# Patient Record
Sex: Female | Born: 1950 | Race: Black or African American | Hispanic: No | Marital: Single | Smoking: Current every day smoker
Health system: Southern US, Community
[De-identification: ages and names within clinical notes are randomized; demographics above are authoritative.]

## PROBLEM LIST (undated history)

## (undated) DIAGNOSIS — G43909 Migraine, unspecified, not intractable, without status migrainosus: Secondary | ICD-10-CM

## (undated) DIAGNOSIS — Z86010 Personal history of colonic polyps: Secondary | ICD-10-CM

## (undated) DIAGNOSIS — F32A Depression, unspecified: Secondary | ICD-10-CM

## (undated) DIAGNOSIS — M797 Fibromyalgia: Secondary | ICD-10-CM

## (undated) DIAGNOSIS — E119 Type 2 diabetes mellitus without complications: Secondary | ICD-10-CM

## (undated) DIAGNOSIS — G4730 Sleep apnea, unspecified: Secondary | ICD-10-CM

## (undated) DIAGNOSIS — I10 Essential (primary) hypertension: Secondary | ICD-10-CM

## (undated) DIAGNOSIS — F329 Major depressive disorder, single episode, unspecified: Secondary | ICD-10-CM

## (undated) DIAGNOSIS — E785 Hyperlipidemia, unspecified: Secondary | ICD-10-CM

## (undated) HISTORY — DX: Fibromyalgia: M79.7

## (undated) HISTORY — DX: Sleep apnea, unspecified: G47.30

## (undated) HISTORY — PX: TUBAL LIGATION: SHX77

## (undated) HISTORY — DX: Depression, unspecified: F32.A

## (undated) HISTORY — DX: Type 2 diabetes mellitus without complications: E11.9

## (undated) HISTORY — DX: Hyperlipidemia, unspecified: E78.5

## (undated) HISTORY — DX: Major depressive disorder, single episode, unspecified: F32.9

## (undated) HISTORY — PX: KNEE ARTHROSCOPY: SUR90

## (undated) HISTORY — DX: Essential (primary) hypertension: I10

## (undated) HISTORY — DX: Migraine, unspecified, not intractable, without status migrainosus: G43.909

## (undated) HISTORY — DX: Personal history of colonic polyps: Z86.010

---

## 1978-09-05 HISTORY — PX: CHOLECYSTECTOMY: SHX55

## 1998-09-05 HISTORY — PX: BREAST BIOPSY: SHX20

## 2001-09-05 HISTORY — PX: APPENDECTOMY: SHX54

## 2002-09-05 HISTORY — PX: ABDOMINAL HYSTERECTOMY: SHX81

## 2003-05-09 ENCOUNTER — Encounter: Payer: Self-pay | Admitting: Obstetrics and Gynecology

## 2003-05-09 ENCOUNTER — Ambulatory Visit (HOSPITAL_COMMUNITY): Admission: RE | Admit: 2003-05-09 | Discharge: 2003-05-09 | Payer: Self-pay | Admitting: Obstetrics and Gynecology

## 2003-05-29 ENCOUNTER — Encounter: Payer: Self-pay | Admitting: Obstetrics and Gynecology

## 2003-05-29 ENCOUNTER — Ambulatory Visit (HOSPITAL_COMMUNITY): Admission: RE | Admit: 2003-05-29 | Discharge: 2003-05-29 | Payer: Self-pay | Admitting: Obstetrics and Gynecology

## 2003-06-25 ENCOUNTER — Other Ambulatory Visit: Admission: RE | Admit: 2003-06-25 | Discharge: 2003-06-25 | Payer: Self-pay | Admitting: Obstetrics and Gynecology

## 2003-07-07 ENCOUNTER — Inpatient Hospital Stay (HOSPITAL_COMMUNITY): Admission: RE | Admit: 2003-07-07 | Discharge: 2003-07-09 | Payer: Self-pay | Admitting: Obstetrics and Gynecology

## 2004-11-25 ENCOUNTER — Ambulatory Visit (HOSPITAL_COMMUNITY): Admission: RE | Admit: 2004-11-25 | Discharge: 2004-11-25 | Payer: Self-pay | Admitting: Orthopedic Surgery

## 2009-09-05 DIAGNOSIS — Z86010 Personal history of colonic polyps: Secondary | ICD-10-CM

## 2009-09-05 DIAGNOSIS — Z860101 Personal history of adenomatous and serrated colon polyps: Secondary | ICD-10-CM

## 2009-09-05 HISTORY — DX: Personal history of colonic polyps: Z86.010

## 2009-09-05 HISTORY — DX: Personal history of adenomatous and serrated colon polyps: Z86.0101

## 2010-01-03 HISTORY — PX: LOW ANTERIOR BOWEL RESECTION: SUR1240

## 2010-01-05 ENCOUNTER — Inpatient Hospital Stay (HOSPITAL_COMMUNITY)
Admission: AD | Admit: 2010-01-05 | Discharge: 2010-01-08 | Payer: Self-pay | Source: Home / Self Care | Admitting: Family Medicine

## 2010-01-06 ENCOUNTER — Ambulatory Visit: Payer: Self-pay | Admitting: Internal Medicine

## 2010-01-07 ENCOUNTER — Encounter (INDEPENDENT_AMBULATORY_CARE_PROVIDER_SITE_OTHER): Payer: Self-pay | Admitting: Family Medicine

## 2010-01-07 ENCOUNTER — Ambulatory Visit: Payer: Self-pay | Admitting: Internal Medicine

## 2010-01-07 HISTORY — PX: ESOPHAGOGASTRODUODENOSCOPY: SHX1529

## 2010-01-07 HISTORY — PX: COLONOSCOPY: SHX174

## 2010-01-12 ENCOUNTER — Telehealth: Payer: Self-pay | Admitting: Gastroenterology

## 2010-01-13 ENCOUNTER — Encounter (INDEPENDENT_AMBULATORY_CARE_PROVIDER_SITE_OTHER): Payer: Self-pay

## 2010-01-25 ENCOUNTER — Encounter (INDEPENDENT_AMBULATORY_CARE_PROVIDER_SITE_OTHER): Payer: Self-pay | Admitting: General Surgery

## 2010-01-25 ENCOUNTER — Inpatient Hospital Stay (HOSPITAL_COMMUNITY): Admission: RE | Admit: 2010-01-25 | Discharge: 2010-01-29 | Payer: Self-pay | Admitting: General Surgery

## 2010-01-26 ENCOUNTER — Encounter (INDEPENDENT_AMBULATORY_CARE_PROVIDER_SITE_OTHER): Payer: Self-pay

## 2010-02-18 ENCOUNTER — Encounter: Payer: Self-pay | Admitting: Internal Medicine

## 2010-02-22 ENCOUNTER — Encounter: Payer: Self-pay | Admitting: Internal Medicine

## 2010-10-05 NOTE — Miscellaneous (Signed)
Summary: surical path  Clinical Lists Changes SP-Surgical Pathology - STATUS: Final  .                                         Perform Date: 5 May11 00:01  Ordered By: Raiford Simmonds Date:  Facility: [facility]                               Department: CPATH  Service Report Text  [facility]   [address], [address]   [place], [place] [address]   Telephone [phone] or [phone] Fax [phone]    REPORT OF SURGICAL PATHOLOGY    Case #: [id number]   Patient Name: Katelyn Powell, Katelyn Powell   Office Chart Number: [id number]    MRN: [id number]   Pathologist: Laureen Ochs M.D., Jessica Priest   DOB/Age [birth year] (Age: 60) Gender: F   Date Taken: 01/07/2010   Date Received: 01/07/2010    FINAL DIAGNOSIS   ***Microscopic Examination and Diagnosis***    1. STOMACH, BIOPSY, ANTRAL LESION : BENIGN GASTRIC MUCOSA WITH MILD CHRONIC   GASTRITIS AND REACTIVE EPITHELIAL CHANGES.NO INTESTINAL   METAPLASIA OR HELICOBACTER PYLORI ORGANISMS IDENTIFIED.   2. COLON, POLYP(S), ILEOCECAL VALVE : ADENOMATOUS POLYP(S).NO HIGH GRADE   DYSPLASIA OR INVASIVE MALIGNANCY IDENTIFIED.   3. COLON, POLYP(S), DESCENDING : ADENOMATOUS POLYP(S).NO HIGH GRADE DYSPLASIA OR   INVASIVE MALIGNANCY IDENTIFIED.   4. COLON, POLYP(S), SIGMOID : ADENOMATOUS POLYP(S).NO HIGH GRADE DYSPLASIA OR   INVASIVE MALIGNANCY IDENTIFIED.   5. RECTUM, BIOPSY, RECTAL MASS : FRAGMENTS OF TUBULOVILLOUS ADENOMA.NO HIGH   GRADE DYSPLASIA OR INVASIVE MALIGNANCY IDENTIFIED.    *** Electronically Signed Out by Smir M.D., Bassam, Pathologist, Electronic Signature ***    CLINICAL HISTORY    SPECIMEN(S) OBTAINED   1. Stomach, biopsy, Antral Lesion   2. Colon, polyp(s), Ileocecal Valve   3. Colon, polyp(s), Descending   4. Colon, polyp(s), Sigmoid   5. Rectum, biopsy, Rectal Mass    SPECIMEN COMMENTS:   5. Anmeia; gi bleed    Gross Description   1. Received in formalin are tan, soft tissue fragments that are  submitted in   toto.Number: Three, size: 0.3 to 0.5 cm, (1 b)   2. Received in formalin are tan, soft tissue fragments that are submitted in   toto.Number: Four, size: 0.2 to 0.4 cm, (1b)   3. Received in formalin are two tan mucosal polyps measuring 0.4 and 1.2 cm in   greatest dimension.The largest is inked and sectioned.The specimen is entirely   submitted in one cassette.   4. Received in formalin are two rubbery tan red mucosal polyps which measure 1.0   and 1.8 cm in greatest dimension.The polyps are inked and sectioned.The specimen   is entirely submitted in three cassettes.   A= one sectioned polyp   B-C= on sectioned polyp   5. Received in formalin are tan, soft tissue fragments that are submitted in   toto.Number: Multiple, size: 0.1 cm smallest to 0.3 cm largest, (1 b) ( gp:mw   01/07/10 )    MICROSCOPIC DESCRIPTION   1. A warthin-starry stain is performed to determine the possibility of the   presence of helicobacter pylori.The warthin-starry stain is negative for   organisms of helicobacter pylori.   (Bns:Mw  01/08/10)    CASE COMMENTS   STAINS USED IN DIAGNOSIS:   Warthin-Starry stain   Additional Information  HL7 RESULT STATUS : F  External IF Update Timestamp : 2010-01-08:17:00:00.000000

## 2010-10-05 NOTE — Progress Notes (Signed)
Summary: RECTAL MASS  Phone Note Outgoing Call Call back at Home Phone [phone]   Reason for Call: Discuss lab or test results Summary of Call: Spoke with pt. Explained CT Scan and CXR-rectal mass, enlarged LNs. CEA not elevated. Proceed with General Surgery Consult: Dr. Lovell Sheehan.      Appended Document: RECTAL MASS I spoke with the pt and gave her Dr Lovell Sheehan office number..I faxed all clinicals to his office.

## 2010-10-05 NOTE — Letter (Signed)
Summary: PATH REPORT  PATH REPORT   Imported By: Ave Filter 02/18/2010 13:42:38  _____________________________________________________________________  External Attachment:    Type:   Image     Comment:   External Document

## 2010-10-05 NOTE — Letter (Signed)
Summary: dischargesummary-dr jenkins  dischargesummary-dr jenkins   Imported By: Rosine Beat 02/22/2010 11:05:44  _____________________________________________________________________  External Attachment:    Type:   Image     Comment:   External Document

## 2010-10-05 NOTE — Miscellaneous (Signed)
Summary: reports from [facility]  Clinical Lists Changes NAME:  Katelyn Powell, Katelyn Powell NO.:  [id number]      MEDICAL RECORD NO.:  [id number]          PATIENT TYPE:  INP      LOCATION:  A322                          FACILITY:  [facility]      PHYSICIAN:  Jonette Eva, M.D.     DATE OF BIRTH:  03/12/1951      DATE OF CONSULTATION:  01/05/2010   DATE OF DISCHARGE:                                    CONSULTATION     REFERRING PROVIDER:  Oval Linsey, MD      REASON FOR CONSULTATION:  Anemia.      HISTORY OF PRESENT ILLNESS:  Katelyn Powell is a 60 year old female who has   never had a colonoscopy or an upper endoscopy.  She presented with   palpitations.  Her hemoglobin was found to be 5.4 with a MCV of 61.6.   She states that her appetite is pretty good.  She has had an intentional   weight loss from 197 pounds to 160 pounds.  She occasionally sees rectal   bleeding.  Her last bowel movement was Saturday and was hard.  She often   has problems with constipation.  She denies any nausea, vomiting,   dysuria, hematuria, heartburn, indigestion, chest pain, shortness of   breath or problems swallowing.  She had a syncopal episode in September   2010.  She has been feeling faint off and on.  She says she eats meat   sometimes.      PAST MEDICAL HISTORY:   1. Hypertension.   2. Diabetes.   3. Hyperlipidemia.   4. Depression.   5. Fibromyalgia.      PAST SURGICAL HISTORY:   1. Hysterectomy.   2. Cholecystectomy.   3. Tubal ligation.   4. Appendectomy.      ALLERGIES:  PENICILLIN, SULFA, CEPHALOSPORINS, LYRICA.      MEDICATIONS:   1. Vitamin D.   2. Catapres.   3. Glipizide.   4. Glucophage.   5. Pamelor.   6. Benicar.   7. Zocor      FAMILY HISTORY:  She denies any family history of colon cancer or colon   polyps.      SOCIAL HISTORY:  She smokes less than half pack a day.  She is divorced.   She does not drink alcohol.  She has three kids.  She has been disabled   from multiple reasons.  She used to work at [employer].      REVIEW OF SYSTEMS:  As per the HPI.  Otherwise all systems are negative.      PHYSICAL EXAM:  VITAL SIGNS: Afebrile, hemodynamically stable.   GENERAL:  She is in no apparent distress, alert and oriented x4.   HEENT:  Exam is atraumatic, normocephalic.  Pupils equal and react to   light.  Mouth:  No oral lesions.  Posterior pharynx __________ .   NECK:  Has full range of motion.  No lymphadenopathy.   LUNGS:  Clear to auscultation bilaterally.   CARDIOVASCULAR:  Regular rhythm,  no murmur, normal S1, S2.   ABDOMEN:  Bowel sounds present.  Soft, nontender, nondistended.  No   rebound or guarding.   EXTREMITIES:  No cyanosis or edema.   NEURO:  She has no focal neurologic deficits.      LABS:  White count 7.5, platelets 366.  PT/PTT pending.      ASSESSMENT:  Katelyn Powell is a 60 year old female who presents with a   microcytic anemia.  The differential diagnosis includes colorectal   cancer/polyp, arteriovenous malformations, and a low likelihood of   gastric cancer.      Thank you for allowing me to see Ms. Aul in consultation.  My   recommendations follow.      RECOMMENDATIONS:   1. The patient may have a full liquid diet on today.  She will start a       clear liquid diet on tomorrow.  She should receive three doses of       MiraLAX today.   2. Transfuse 4 units of packed red blood cells, each unit over 3 to 4       hours.   3. She was started bowel prep on tomorrow.  She will be scheduled for       her colonoscopy and EGD on Jan 07, 2010.   4. Check BMP and ferritin.   5. Start D5 normal saline at 1700 hours on Jan 06, 2010.   6. Protonix for GI prophylaxis.               Jonette Eva, M.D.            SF/MEDQ  D:  01/05/2010  T:  01/05/2010  Job:  [id number]      cc:   Melvyn Novas, MD   Fax: [phone]     NAME:  Powell, Katelyn              ACCOUNT NO.:  [id number]      MEDICAL RECORD NO.:   [id number]          PATIENT TYPE:  INP      LOCATION:  A322                          FACILITY:  [facility]      PHYSICIAN:  R. Roetta Sessions, M.D. DATE OF BIRTH:  [birth year]      DATE OF PROCEDURE:  01/07/2010   DATE OF DISCHARGE:                                  OPERATIVE REPORT     PROCEDURE:  EGD with biopsy followed by colonoscopy with snare   polypectomy biopsy, polyp ablation.      INDICATIONS FOR PROCEDURE:  A 60 year old lady who presents with   profound microcytic iron-deficiency anemia, rectal bleeding, Hemoccult   positive stool.  No prior EGD or colonoscopy.  EGD and colonoscopy now   being done.  Risks, benefits, alternatives,  limitations, imponderables   have been discussed, questions answered.  All parties agreeable.      PROCEDURE NOTE:   O2 saturation, blood pressure, pulse, respirations monitored throughout   the entirety of the procedure.      CONSCIOUS SEDATION:  Versed 7 mg IV, Demerol 125 mg IV in divided doses.      Cetacaine spray for topical pharyngeal anesthesia.      INSTRUMENT:  Pentax video chip system.      FINDINGS:  EGD examination:  Tubular esophagus revealed no mucosal   abnormalities.  The EG junction easily traversed.      STOMACH:   The gastric cavity was emptied and insufflated well with air.  Thorough   examination of the gastric mucosa including retroflex of the proximal   stomach and esophagogastric junction demonstrated a small hiatal hernia,   a couple of linear antral erosions, a 1.5 cm area of raised mucosa   superior to the pylorus and antrum with an area of centrally eroded   mucosa.  This lesion had a doughnut type appearance.  No obvious   infiltrating process, but it did stand out.  The remaining gastric   mucosa appeared unremarkable.  Pylorus was patent and easily traversed.      DUODENUM:   Examination of the bulb and second portion revealed 2 nonbleeding D2   AVMs.  Otherwise, duodenum was unremarkable.       THERAPEUTIC/DIAGNOSTIC MANEUVERS:  The 1.5 cm area of abnormality at the   antrum was biopsied for histologic study.  The patient tolerated the   procedure well and was prepared for colonoscopy.      Digital rectal exam revealed no abnormalities.      Scope findings:      Prep was adequate.      RECTUM:  Examination of rectal mucosa revealed a large exophytic   polypoid mass beginning at 12 cm from the anal verge extending   approximately 15 cm (more proximal rectum) distal to 12 cm.  Rectal   mucosa appeared entirely normal.  Thorough retroflexion was performed.   Again, this was not appreciable on digital rectal exam.  Lumen was not   particularly compromised.      I was able to get up into the more proximal lower GI tract without   difficulty.  Examination of the colon was undertaken.  I easily advanced   the scope through the left, transverse and right colon to the   appendiceal orifice, ileocecal valve/cecum.  These structures were well   seen and photographed for the record.  From this level, the scope was   slowly withdrawn.  All previously mentioned mucosal surfaces were again   seen.  The following abnormalities were noted:   Left-sided transverse diverticula.  The patient had a flat 8-mm polyp   between 2 folds opposite the ileocecal valve.  This lesion was removed   with hot snare cautery.  The patient had multiple other large polyps in   the splenic flexure, descending and sigmoid segments.  The largest   polyps were in the sigmoid where the very largest was a 2 cm   pedunculated polyp on a long stalk.  Polyps were either hot snared with   a couple of diminutive polyps ablated at the splenic flexure and sigmoid   segment.  All polyps seen were treated/removed.  Attention was turned to   the lesion in the rectum where multiple biopsies were taken utilizing   jumbo biopsy forceps.  The patient tolerated both procedures well.   Cecal withdrawal time 39 minutes.       IMPRESSION:  EGD normal esophagus, small hiatal hernia, antral erosions   1.5 cm doughnut shaped lesion as described above in the antrum of   uncertain significance, biopsied.  Patent pylorus, nonbleeding AVMs and   DT.      COLONOSCOPY FINDINGS:   1. 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  Multiple biopsies taken.   2. Multiple colonic polyps as described above, either ablated or snare       removed.   3. Left-sided transverse diverticula.  Recommendations:  Clear liquid       diet.   4. Follow up on pathology.   5. The patient will need at a minimum surgical consultation for a low       anterior resection of the rectal mass.  All pathology will need to       be reviewed prior to making final recommendations regarding       surgery.               Jonathon Bellows, M.D.            RMR/MEDQ  D:  01/07/2010  T:  01/07/2010  Job:  [id number]      cc:   Melvyn Novas, MD   Fax: [phone]     SP-Surgical Pathology - STATUS: Final  .                                         Perform Date: 5 May11 00:01  Ordered By: Raiford Simmonds Date:  Facility: [facility]                               Department: [facility]  Service Report Text  [facility]   [address], [address]   [place], [place] [address]   Telephone [phone] or [phone] Fax [phone]    REPORT OF SURGICAL PATHOLOGY    Case #: [id number]   Patient Name: CARESSE, SEDIVY   Office Chart Number: [id number]    MRN: [id number]   Pathologist: Laureen Ochs M.D., Jessica Priest   DOB/Age 03-30-1951 (Age: 40) Gender: F   Date Taken: 01/07/2010   Date Received: 01/07/2010    FINAL DIAGNOSIS   ***Microscopic Examination and Diagnosis***    1. STOMACH, BIOPSY, ANTRAL LESION : BENIGN GASTRIC MUCOSA WITH MILD CHRONIC   GASTRITIS AND REACTIVE  EPITHELIAL CHANGES.NO INTESTINAL   METAPLASIA OR HELICOBACTER PYLORI ORGANISMS IDENTIFIED.   2. COLON, POLYP(S), ILEOCECAL VALVE : ADENOMATOUS POLYP(S).NO HIGH GRADE   DYSPLASIA OR INVASIVE MALIGNANCY IDENTIFIED.   3. COLON, POLYP(S), DESCENDING : ADENOMATOUS POLYP(S).NO HIGH GRADE DYSPLASIA OR   INVASIVE MALIGNANCY IDENTIFIED.   4. COLON, POLYP(S), SIGMOID : ADENOMATOUS POLYP(S).NO HIGH GRADE DYSPLASIA OR   INVASIVE MALIGNANCY IDENTIFIED.   5. RECTUM, BIOPSY, RECTAL MASS : FRAGMENTS OF TUBULOVILLOUS ADENOMA.NO HIGH   GRADE DYSPLASIA OR INVASIVE MALIGNANCY IDENTIFIED.    *** Electronically Signed Out by Smir M.D., Bassam, Pathologist, Electronic Signature ***    CLINICAL HISTORY    SPECIMEN(S) OBTAINED   1. Stomach, biopsy, Antral Lesion   2. Colon, polyp(s), Ileocecal Valve   3. Colon, polyp(s), Descending   4. Colon, polyp(s), Sigmoid   5. Rectum, biopsy, Rectal Mass    SPECIMEN COMMENTS:   5. Anmeia; gi bleed    Gross Description   1. Received in formalin are tan, soft tissue fragments that are submitted in   toto.Number:  Three, size: 0.3 to 0.5 cm, (1 b)   2. Received in formalin are tan, soft tissue fragments that are submitted in   toto.Number: Four, size: 0.2 to 0.4 cm, (1b)   3. Received in formalin are two tan mucosal polyps measuring 0.4 and 1.2 cm in   greatest dimension.The largest is inked and sectioned.The specimen is entirely   submitted in one cassette.   4. Received in formalin are two rubbery tan red mucosal polyps which measure 1.0   and 1.8 cm in greatest dimension.The polyps are inked and sectioned.The specimen   is entirely submitted in three cassettes.   A= one sectioned polyp   B-C= on sectioned polyp   5. Received in formalin are tan, soft tissue fragments that are submitted in   toto.Number: Multiple, size: 0.1 cm smallest to 0.3 cm largest, (1 b) ( gp:mw   01/07/10 )    MICROSCOPIC DESCRIPTION   1. A warthin-starry stain is performed to determine  the possibility of the   presence of helicobacter pylori.The warthin-starry stain is negative for   organisms of helicobacter pylori.   (Bns:Mw 01/08/10)    CASE COMMENTS   STAINS USED IN DIAGNOSIS:   Warthin-Starry stain   Additional Information  HL7 RESULT STATUS : F  External IF Update Timestamp : 2010-01-08:17:00:00.000000    CT Abd/Pelvis W CM - STATUS: Final  IMAGE                                     Perform Date: 6 May11 16:30  Ordered By: Raiford Simmonds Date: 6 May11 14:27  Facility: [facility]                               Department: CT  Service Report Text  [facility] Accession Number: [id number]      Clinical Data: Rectal cancer found on recent colonoscopy.  Prior   cholecystectomy, hysterectomy and appendectomy.  Known hiatal   hernia.    CT ABDOMEN AND PELVIS WITH CONTRAST    Technique:  Multidetector CT imaging of the abdomen and pelvis was   performed following the standard protocol during bolus   administration of intravenous contrast.    Contrast: 100 ml Omnipaque-300.    Comparison: None.    Findings: Large mass distal rectum/distal sigmoid colon.  Small   amount of free fluid in the pelvis and Morison's pouch.  This may   be related to malignancy.  No free air to suggest complication of   colonoscopy.    Largest lymph node in the pelvis and in the right obturator region   with with maximal transverse dimension of 19.4 x 7.4 mm.    Elongated lymph node to the left of the aorta at the takeoff of the   superior mesenteric artery (series 2 image 26) measures 21.6 x 6.2   mm.    Within the dome of the liver very questionable 4.6 x 4 x 4.3 mm   lesion (series 4 image 56 and series 5 image 21). No other liver   lesions.    Mild hyperplasia of the left adrenal gland.  No nodularity to   suggest adrenal lesion.  Nonobstructing right renal calculi.  No   renal, splenic or pancreatic mass.    Atherosclerotic type changes of the  aorta and  branch vessels   without evidence of abdominal aortic aneurysm.    Evaluation of portions of the bowel and stomach limited secondary   to under distension.    No bony destructive lesion.  Prominent L5-S1 facet joint   degenerative changes with minimal anterior slip of L5 and bulge   with subsequent bilateral foraminal narrowing.    IMPRESSION:   Large mass distal rectum/distal sigmoid colon.  Small amount of   free fluid in the pelvis and Morison's pouch.  This may be related   to malignancy.  No free air to suggest complication of colonoscopy.    Elongated left periaortic and right obturator lymph nodes.  No   pathologically enlarged lymph nodes.    Within the dome of the liver very questionable 4.6 x 4 x 4.3 mm   lesion (series 4 image 56 and series 5 image 21). No other liver   lesions noted.    Read By:  Fuller Canada,  M.D.   Released By:  Fuller Canada,  M.D.  Additional Information  HL7 RESULT STATUS : F  External image : [id number]  External IF Update Timestamp : 2010-01-08:17:16:34.000000

## 2010-11-22 LAB — CBC
HCT: 29.1 % — ABNORMAL LOW (ref 36.0–46.0)
HCT: 32 % — ABNORMAL LOW (ref 36.0–46.0)
HCT: 36.2 % (ref 36.0–46.0)
Hemoglobin: 11.8 g/dL — ABNORMAL LOW (ref 12.0–15.0)
Hemoglobin: 8.8 g/dL — ABNORMAL LOW (ref 12.0–15.0)
MCHC: 30.3 g/dL (ref 30.0–36.0)
MCHC: 32.7 g/dL (ref 30.0–36.0)
MCHC: 33 g/dL (ref 30.0–36.0)
MCV: 73.9 fL — ABNORMAL LOW (ref 78.0–100.0)
MCV: 74.1 fL — ABNORMAL LOW (ref 78.0–100.0)
MCV: 74.7 fL — ABNORMAL LOW (ref 78.0–100.0)
Platelets: 332 10*3/uL (ref 150–400)
Platelets: 395 10*3/uL (ref 150–400)
RBC: 3.91 MIL/uL (ref 3.87–5.11)
RBC: 4.29 MIL/uL (ref 3.87–5.11)
RDW: 29.3 % — ABNORMAL HIGH (ref 11.5–15.5)
RDW: 31.9 % — ABNORMAL HIGH (ref 11.5–15.5)
RDW: 32 % — ABNORMAL HIGH (ref 11.5–15.5)
WBC: 19.8 10*3/uL — ABNORMAL HIGH (ref 4.0–10.5)
WBC: 22.2 10*3/uL — ABNORMAL HIGH (ref 4.0–10.5)
WBC: 22.8 10*3/uL — ABNORMAL HIGH (ref 4.0–10.5)

## 2010-11-22 LAB — HEPATIC FUNCTION PANEL
ALT: 20 U/L (ref 0–35)
Albumin: 4 g/dL (ref 3.5–5.2)
Alkaline Phosphatase: 116 U/L (ref 39–117)

## 2010-11-22 LAB — BASIC METABOLIC PANEL
BUN: 6 mg/dL (ref 6–23)
BUN: 7 mg/dL (ref 6–23)
CO2: 28 mEq/L (ref 19–32)
CO2: 31 mEq/L (ref 19–32)
Calcium: 9.5 mg/dL (ref 8.4–10.5)
Calcium: 9.7 mg/dL (ref 8.4–10.5)
Chloride: 100 mEq/L (ref 96–112)
Chloride: 105 mEq/L (ref 96–112)
Creatinine, Ser: 0.56 mg/dL (ref 0.4–1.2)
Creatinine, Ser: 0.59 mg/dL (ref 0.4–1.2)
Creatinine, Ser: 0.62 mg/dL (ref 0.4–1.2)
GFR calc Af Amer: >60 mL/min (ref >60)
GFR calc Af Amer: >60 mL/min (ref >60)
GFR calc non Af Amer: >60 mL/min (ref >60)
Glucose, Bld: 138 mg/dL — ABNORMAL HIGH (ref 70–99)
Glucose, Bld: 161 mg/dL — ABNORMAL HIGH (ref 70–99)
Potassium: 4.2 mEq/L (ref 3.5–5.1)
Sodium: 136 mEq/L (ref 135–145)
Sodium: 136 mEq/L (ref 135–145)
Sodium: 140 mEq/L (ref 135–145)

## 2010-11-22 LAB — GLUCOSE, CAPILLARY
Glucose-Capillary: 126 mg/dL — ABNORMAL HIGH (ref 70–99)
Glucose-Capillary: 134 mg/dL — ABNORMAL HIGH (ref 70–99)
Glucose-Capillary: 136 mg/dL — ABNORMAL HIGH (ref 70–99)
Glucose-Capillary: 143 mg/dL — ABNORMAL HIGH (ref 70–99)
Glucose-Capillary: 156 mg/dL — ABNORMAL HIGH (ref 70–99)
Glucose-Capillary: 161 mg/dL — ABNORMAL HIGH (ref 70–99)
Glucose-Capillary: 180 mg/dL — ABNORMAL HIGH (ref 70–99)
Glucose-Capillary: 190 mg/dL — ABNORMAL HIGH (ref 70–99)

## 2010-11-22 LAB — DIFFERENTIAL
Band Neutrophils: 3 % (ref 0–10)
Basophils Absolute: 0 10*3/uL (ref 0.0–0.1)
Basophils Relative: 0 % (ref 0–1)
Blasts: 0 %
Eosinophils Absolute: 0 10*3/uL (ref 0.0–0.7)
Eosinophils Absolute: 0 10*3/uL (ref 0.0–0.7)
Eosinophils Relative: 0 % (ref 0–5)
Lymphocytes Relative: 7 % — ABNORMAL LOW (ref 12–46)
Lymphocytes Relative: 9 % — ABNORMAL LOW (ref 12–46)
Lymphs Abs: 0.7 10*3/uL (ref 0.7–4.0)
Lymphs Abs: 1.1 10*3/uL (ref 0.7–4.0)
Metamyelocytes Relative: 0 %
Monocytes Absolute: 0.6 10*3/uL (ref 0.1–1.0)
Monocytes Relative: 4 % (ref 3–12)
Neutro Abs: 20.3 10*3/uL — ABNORMAL HIGH (ref 1.7–7.7)
Neutro Abs: 21.5 10*3/uL — ABNORMAL HIGH (ref 1.7–7.7)
Neutrophils Relative %: 91 % — ABNORMAL HIGH (ref 43–77)
Neutrophils Relative %: 94 % — ABNORMAL HIGH (ref 43–77)
Promyelocytes Absolute: 0 %
nRBC: 0 /100 WBC

## 2010-11-22 LAB — CROSSMATCH
ABO/RH(D): O POS
Antibody Screen: NEGATIVE

## 2010-11-22 LAB — URINE CULTURE
Colony Count: NO GROWTH
Culture: NO GROWTH
Special Requests: NEGATIVE

## 2010-11-22 LAB — URINALYSIS, ROUTINE W REFLEX MICROSCOPIC
Glucose, UA: NEGATIVE mg/dL
Ketones, ur: NEGATIVE mg/dL
Nitrite: NEGATIVE
Protein, ur: NEGATIVE mg/dL
Urobilinogen, UA: 1 mg/dL (ref 0.0–1.0)

## 2010-11-22 LAB — PHOSPHORUS
Phosphorus: 2.1 mg/dL — ABNORMAL LOW (ref 2.3–4.6)
Phosphorus: 2.6 mg/dL (ref 2.3–4.6)

## 2010-11-22 LAB — MAGNESIUM: Magnesium: 1.7 mg/dL (ref 1.5–2.5)

## 2010-11-22 LAB — ALBUMIN: Albumin: 2.5 g/dL — ABNORMAL LOW (ref 3.5–5.2)

## 2010-11-23 LAB — BASIC METABOLIC PANEL
BUN: 9 mg/dL (ref 6–23)
CO2: 26 mEq/L (ref 19–32)
Calcium: 9.7 mg/dL (ref 8.4–10.5)
Chloride: 107 mEq/L (ref 96–112)
Creatinine, Ser: 0.63 mg/dL (ref 0.4–1.2)
GFR calc Af Amer: >60 mL/min (ref >60)
GFR calc non Af Amer: >60 mL/min (ref >60)
Glucose, Bld: 106 mg/dL — ABNORMAL HIGH (ref 70–99)
Potassium: 3.8 mEq/L (ref 3.5–5.1)
Sodium: 140 mEq/L (ref 135–145)

## 2010-11-23 LAB — CBC
Hemoglobin: 5.4 g/dL — CL (ref 12.0–15.0)
Hemoglobin: 9.1 g/dL — ABNORMAL LOW (ref 12.0–15.0)
RBC: 2.98 MIL/uL — ABNORMAL LOW (ref 3.87–5.11)
RBC: 3.82 MIL/uL — ABNORMAL LOW (ref 3.87–5.11)

## 2010-11-23 LAB — CROSSMATCH: ABO/RH(D): O POS

## 2010-11-23 LAB — DIFFERENTIAL
Band Neutrophils: 2 % (ref 0–10)
Basophils Absolute: 0 10*3/uL (ref 0.0–0.1)
Basophils Relative: 0 % (ref 0–1)
Eosinophils Absolute: 0 10*3/uL (ref 0.0–0.7)
Eosinophils Relative: 0 % (ref 0–5)
Lymphocytes Relative: 17 % (ref 12–46)
Lymphocytes Relative: 24 % (ref 12–46)
Lymphs Abs: 1.3 10*3/uL (ref 0.7–4.0)
Monocytes Absolute: 0.5 10*3/uL (ref 0.1–1.0)
Monocytes Absolute: 0.5 10*3/uL (ref 0.1–1.0)
Monocytes Relative: 5 % (ref 3–12)
Monocytes Relative: 6 % (ref 3–12)
Neutro Abs: 6.5 10*3/uL (ref 1.7–7.7)

## 2010-11-23 LAB — GLUCOSE, CAPILLARY
Glucose-Capillary: 113 mg/dL — ABNORMAL HIGH (ref 70–99)
Glucose-Capillary: 72 mg/dL (ref 70–99)
Glucose-Capillary: 85 mg/dL (ref 70–99)
Glucose-Capillary: 92 mg/dL (ref 70–99)
Glucose-Capillary: 99 mg/dL (ref 70–99)

## 2010-11-23 LAB — HEMOCCULT GUIAC POC 1CARD (OFFICE): Fecal Occult Bld: POSITIVE

## 2010-11-23 LAB — CEA: CEA: 2.5 ng/mL (ref 0.0–5.0)

## 2010-11-23 LAB — FERRITIN: Ferritin: 3 ng/mL — ABNORMAL LOW (ref 10–291)

## 2010-11-23 LAB — OCCULT BLOOD (STOOL CUP TO LAB): Fecal Occult Bld: POSITIVE

## 2010-11-23 LAB — HEMOGLOBIN AND HEMATOCRIT, BLOOD
Hemoglobin: 9.7 g/dL — ABNORMAL LOW (ref 12.0–15.0)
Hemoglobin: 9.8 g/dL — ABNORMAL LOW (ref 12.0–15.0)

## 2011-01-21 NOTE — Op Note (Signed)
NAME:  Katelyn Powell, Katelyn Powell NO.:  [id number]   MEDICAL RECORD NO.:  [id number]                   PATIENT TYPE:  AMB   LOCATION:  DAY                                  FACILITY:  [facility]   PHYSICIAN:  Tilda Burrow, M.D.              DATE OF BIRTH:  08-28-1951   DATE OF PROCEDURE:  07/07/2003  DATE OF DISCHARGE:                                 OPERATIVE REPORT   PREOPERATIVE DIAGNOSES:  1. Symptoms uterine fibroids with pelvic pain.  2. Fibromyalgia.  3. Sleep apnea.   POSTOPERATIVE DIAGNOSES:  1. Symptoms uterine fibroids with pelvic pain.  2. Fibromyalgia.  3. Sleep apnea.   OPERATION/PROCEDURE:  Total abdominal hysterectomy, bilateral salpingo-  oophorectomy.   SURGEON:  Tilda Burrow, M.D.   ASSISTANTRolm Baptise, C.S.T.   ANESTHESIA:  General.   COMPLICATIONS:  None.   ESTIMATED BLOOD LOSS:  200 cc.   FINDINGS:  1. Globular uterus deep in pelvis.  2. Fibroids in each fundal aspects of the uterus.  3. Normal-appearing ovaries.   DESCRIPTION OF PROCEDURE:  The patient was taken to the operating room,  prepped and draped for lower abdominal surgery with vaginal prep performed  and Pfannenstiel incision in the standard fashion.  Bowel was packed away  with two moistened lap tapes and a rolled moistened green towel, and then  Balfour retractor was in position.  The uterus was grasped, elevated with  Lahey thyroid tenaculum and large, firm round ligaments were  taken down on  either side.  The bladder flap was developed anteriorly.  The bladder was  high on the anterior abdominal wall and only minimally on the lower uterine  segment.  The infundibulopelvic ligament on the right side was isolated,  doubly clamped, cut and suture ligated with 0 chromic.  The ureter was well  out of the surgical arena.  The opposite side was treated similarly.  At  this point the uterine vessels were skeletonized with sharp dissection  sufficient that a curved  Heaney clamp could be placed across them on the  right with Kelly clamp placed for backbleeding with transection and 0  chromic suture ligation.  The opposite side was treated similarly.  We then  clamped on the upper cardinal ligaments with straight Heaney clamps,  transected and ligated with 0 chromic.  The uterine fundus was then  amputated off the lower uterine segment for improved visibility, and then we  marched down the remaining 2 cm of cervix, clamping, cutting and ligating of  the lower cardinal ligaments on either side using straight Heaney clamps,  knife dissection and 0 chromic suture ligation.  The cervix was then  amputated off the stump by placing a stab incision in the anterior cervical  vaginal fornix, circumscribing the cervix and removing it with the vaginal  cuff closed and with Aldridge stitches at each lateral edge of the angle to  improve  hemostasis and subsequently the cuff was closed transversely using  interrupted 0 chromic.  The patient tolerated the procedure well.  Irrigation was then performed and pelvis confirmed as hemostatic.  The  bladder flap peritoneum easily covered the vaginal cuff and did not require  suturing.  Inspection of the pelvis had shown hemostasis to be satisfactory.   Laparotomy equipment was removed.  Some fluid left in the abdomen.  The  anterior peritoneum closed using 2-0 chromic, the fascia with 0 Vicryl,  subcutaneous tissue closed with interrupted 2-0 plain and simple closure of  the skin completed the Prolene with 200 mL estimated blood loss.  Sponge and  needle counts were correct throughout.   Hemoglobin A1C returns from admission at 10.3, significantly improved from  the 13 noted one month ago.      ___________________________________________                                            Tilda Burrow, M.D.   JVF/MEDQ  D:  07/07/2003  T:  07/07/2003  Job:  [id number]   cc:   Forrest Moron

## 2011-01-21 NOTE — H&P (Signed)
NAME:  Katelyn Powell, [place] NO.:  [id number]   MEDICAL RECORD NO.:  [id number]                   PATIENT TYPE:  AMB   LOCATION:  DAY                                  FACILITY:  [facility]   PHYSICIAN:  Tilda Burrow, M.D.              DATE OF BIRTH:  29-Oct-1950   DATE OF ADMISSION:  DATE OF DISCHARGE:                                HISTORY & PHYSICAL   ADMISSION DIAGNOSES:  1. Symptomatic uterine fibroids  2. Fibromyalgia.  3. Sleep apnea.   HISTORY OF PRESENT ILLNESS:  This 60 year old female, postmenopausal, is  admitted at this time for abdominal hysterectomy.  The patient has been  complaining of pelvic pressure and has been evaluated recently and found to  have fibroid tumors which are uncomfortable on bimanual exam.  The uterus is  only slightly enlarged but has on its posterior surface a 4.6 cm fibroid  which presses on adjacent structures.  This is tolerated more poorly by the  patient due to her fibromyalgia and general low pain tolerance.  Technical  aspects of hysterectomy have been reviewed with the patient who desires to  proceed at this time with hysterectomy.  Removal of ovaries is planned.   Evaluation to date has included GC and Chlamydia which is normal, Pap smear  which is class I, ultrasound which was notable for 8 mm endometrial stripe  which is slightly above normal limits for postmenopausal status with  endometrial biopsy benign.   PAST MEDICAL HISTORY:  1. Diabetes mellitus recently diagnosed with hemoglobin A1C dramatically     elevated at 13.1 in early September.  The patient has subsequently had     diabetic control addressed with Dr. Doyne Keel with dramatic improvement.     She has been able to quit Actos and continues Glyburide as a medication.     In the last two weeks has shown blood sugars in the 150 range primarily.     Hemoglobin A1C is ordered at the time of admission.  2. Sleep apnea.  3. Fibromyalgia.   PAST  SURGICAL HISTORY:  1. Tubal ligation.  2. Gallbladder.  3. Appendectomy.  4. Knee surgery.   ALLERGIES:  KEFLEX, SULFONAMIDES, STEROIDS, and PENICILLIN.   MEDICATIONS:  As of June 18, 2003, medications obtained through [facility] by Dr. Doyne Keel include:  1. Bumetanide 1 mg daily for fluid.  2. Norvasc 5 mg p.o. daily.  3. Valsartan 50 mg p.o. daily.  4. Enalapril 10 mg p.o. daily.  5. Glipizide 10 mg b.i.d.  6. Actos has been discontinued.  7. Baclofen 10 mg 1 q.a.m. and 2 q.h.s.  8. Nortriptyline 75 mg q.h.s.   PHYSICAL EXAMINATION:  GENERAL:  Moderately obese African-American female,  alert and oriented x 3.  HEENT:  Pupils equal, round, and reactive.  NECK:  Supple.  Trachea midline.  CHEST:  Clear to auscultation.  ABDOMEN:  Without masses.  PELVIC:  External genitalia normal female.  Vaginal exam shows adequate  support.  Pap smear class I.  GC and chlamydia negative.  Uterus sounds to 8  cm  with scanty tissue normal on recent biopsy.  Adnexa negative for masses  other than the previously mentioned fibroid to the left of the midline  extending posteriorly and causing symptomatic pressure on structures behind  it.   PLAN:  Abdominal hysterectomy with removal of tubes, ovaries, and cervix on  July 07, 2003.     ___________________________________________                                         Tilda Burrow, M.D.   JVF/MEDQ  D:  07/05/2003  T:  07/05/2003  Job:  [id number]   cc:   Dr. Doyne Keel

## 2011-01-21 NOTE — Discharge Summary (Signed)
   NAME:  Katelyn, LECCESE NO.:  [id number]   MEDICAL RECORD NO.:  [id number]                   PATIENT TYPE:  INP   LOCATION:  A417                                 FACILITY:  [facility]   PHYSICIAN:  Tilda Burrow, M.D.              DATE OF BIRTH:  [birth year]   DATE OF ADMISSION:  07/07/2003  DATE OF DISCHARGE:  07/09/2003                                 DISCHARGE SUMMARY   ADMISSION DIAGNOSES:  1. Symptomatic uterine fibroids.  2. Pelvic pain.  3. Fibromyalgia.  4. Sleep apnea.   DISCHARGE DIAGNOSES:  1. Symptomatic uterine fibroids.  2. Pelvic pain.  3. Fibromyalgia.  4. Sleep apnea.  5. Essential hypertension.  6. Type 2 diabetes mellitus.   PROCEDURE:  1. 68.4 - Total abdominal hysterectomy.  2. 6561 - Bilateral salpingo-oophorectomy.   DISCHARGE MEDICATIONS:  1. Norvasc 5 mg 1 p.o. q. a.m.  2. Enalapril 1 p.o. q. a.m.  3. Fluid pill, name unknown, 1 mg p.o. q. a.m.  4. Baclofen 1 q. a.m. and 1 q. p.m.  5. Nortriptyline 1 q. p.m. and q.h.s.  6. Glipizide 1 p.o. b.i.d.  7. Diovan 1 p.o. q. a.m.  8. Tylox  1 to 2 q. 4h. p.r.n. pain.   HOSPITAL COURSE:  The patient was admitted with pelvic discomfort due to a  4.6 cm fibroid uterus, which was pressing on adjacent structures. Surgical  procedure revealed a globular uterus, deep in the pelvis, with challenging  pelvic access due to the location of the 2 fibroids. Uterine weight was 166  grams with fibroids confirmed. There were multiple small fibroids, normal  ovaries, and no other specific pathology identified. The endometrium was  interpreted as simple polypoid endometrial hyperplasia.   Postoperatively, the patient did well with postoperative hemoglobin of 9.9,  hematocrit 30.1 down from 12.7 and 38.9 respectively prior to surgery. She  was tolerating a regular diet and desires to going home on postoperative day  2 with incision clean, abdomen soft, bowel sounds active with regular  diet  and tolerating.   DISPOSITION:  The patient was discharged home for followup in 3 days for  incision check and as needed thereafter for routine postoperative care.     ___________________________________________                                         Tilda Burrow, M.D.   JVF/MEDQ  D:  07/20/2003  T:  07/20/2003  Job:  [id number]   cc:   Forrest Moron

## 2011-02-07 ENCOUNTER — Other Ambulatory Visit (HOSPITAL_COMMUNITY): Payer: Self-pay | Admitting: Family Medicine

## 2011-02-07 DIAGNOSIS — Z139 Encounter for screening, unspecified: Secondary | ICD-10-CM

## 2011-02-15 ENCOUNTER — Ambulatory Visit (HOSPITAL_COMMUNITY)
Admission: RE | Admit: 2011-02-15 | Discharge: 2011-02-15 | Disposition: A | Discharge status: Home / Self Care | Payer: Medicare Other | Source: Ambulatory Visit | Attending: Family Medicine | Admitting: Family Medicine

## 2011-02-15 DIAGNOSIS — Z1231 Encounter for screening mammogram for malignant neoplasm of breast: Secondary | ICD-10-CM | POA: Insufficient documentation

## 2011-02-15 DIAGNOSIS — Z139 Encounter for screening, unspecified: Secondary | ICD-10-CM

## 2011-03-08 IMAGING — CR DG CHEST 2V
2 series · 2 of 2 positions shown · non-contrast
Comparison: None.

CLINICAL DATA: Diabetes and hypertension.  NSAID abuse.  History of
colon cancer

CHEST - 2 VIEW

[view not recorded (1 of 2)]
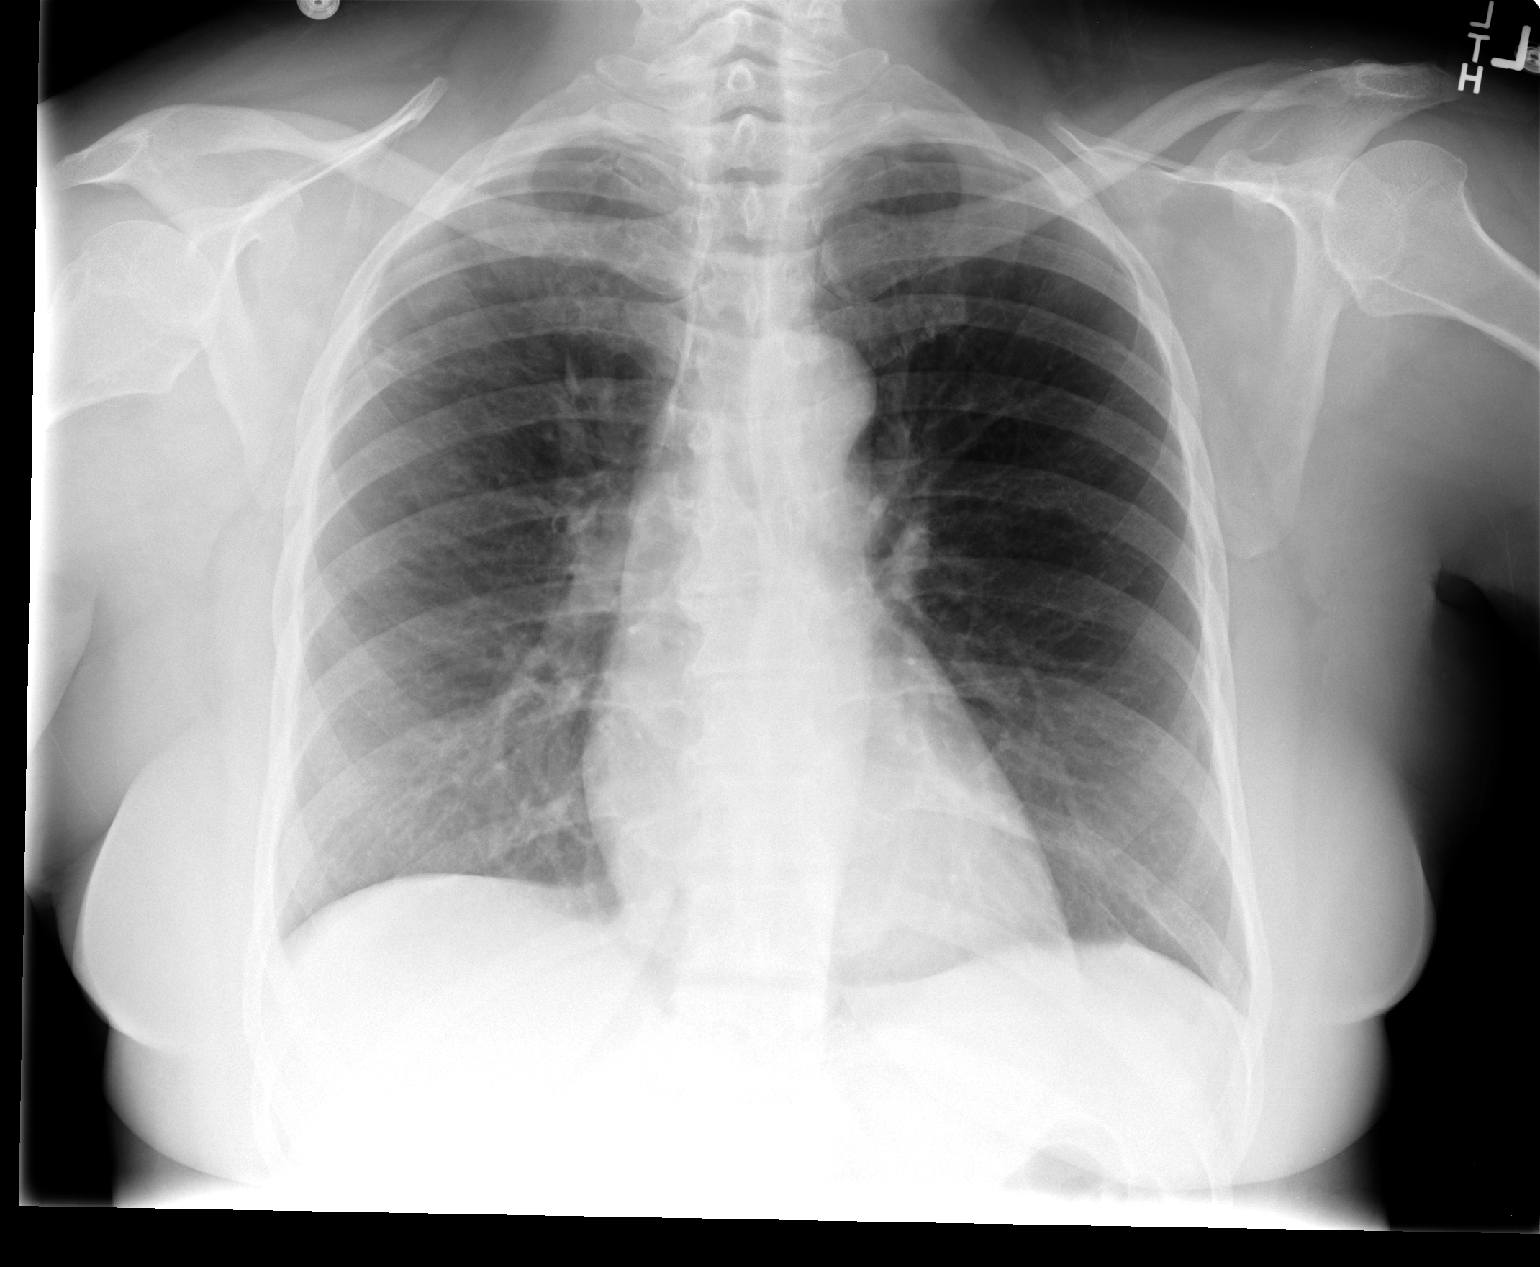

[view not recorded (2 of 2)]
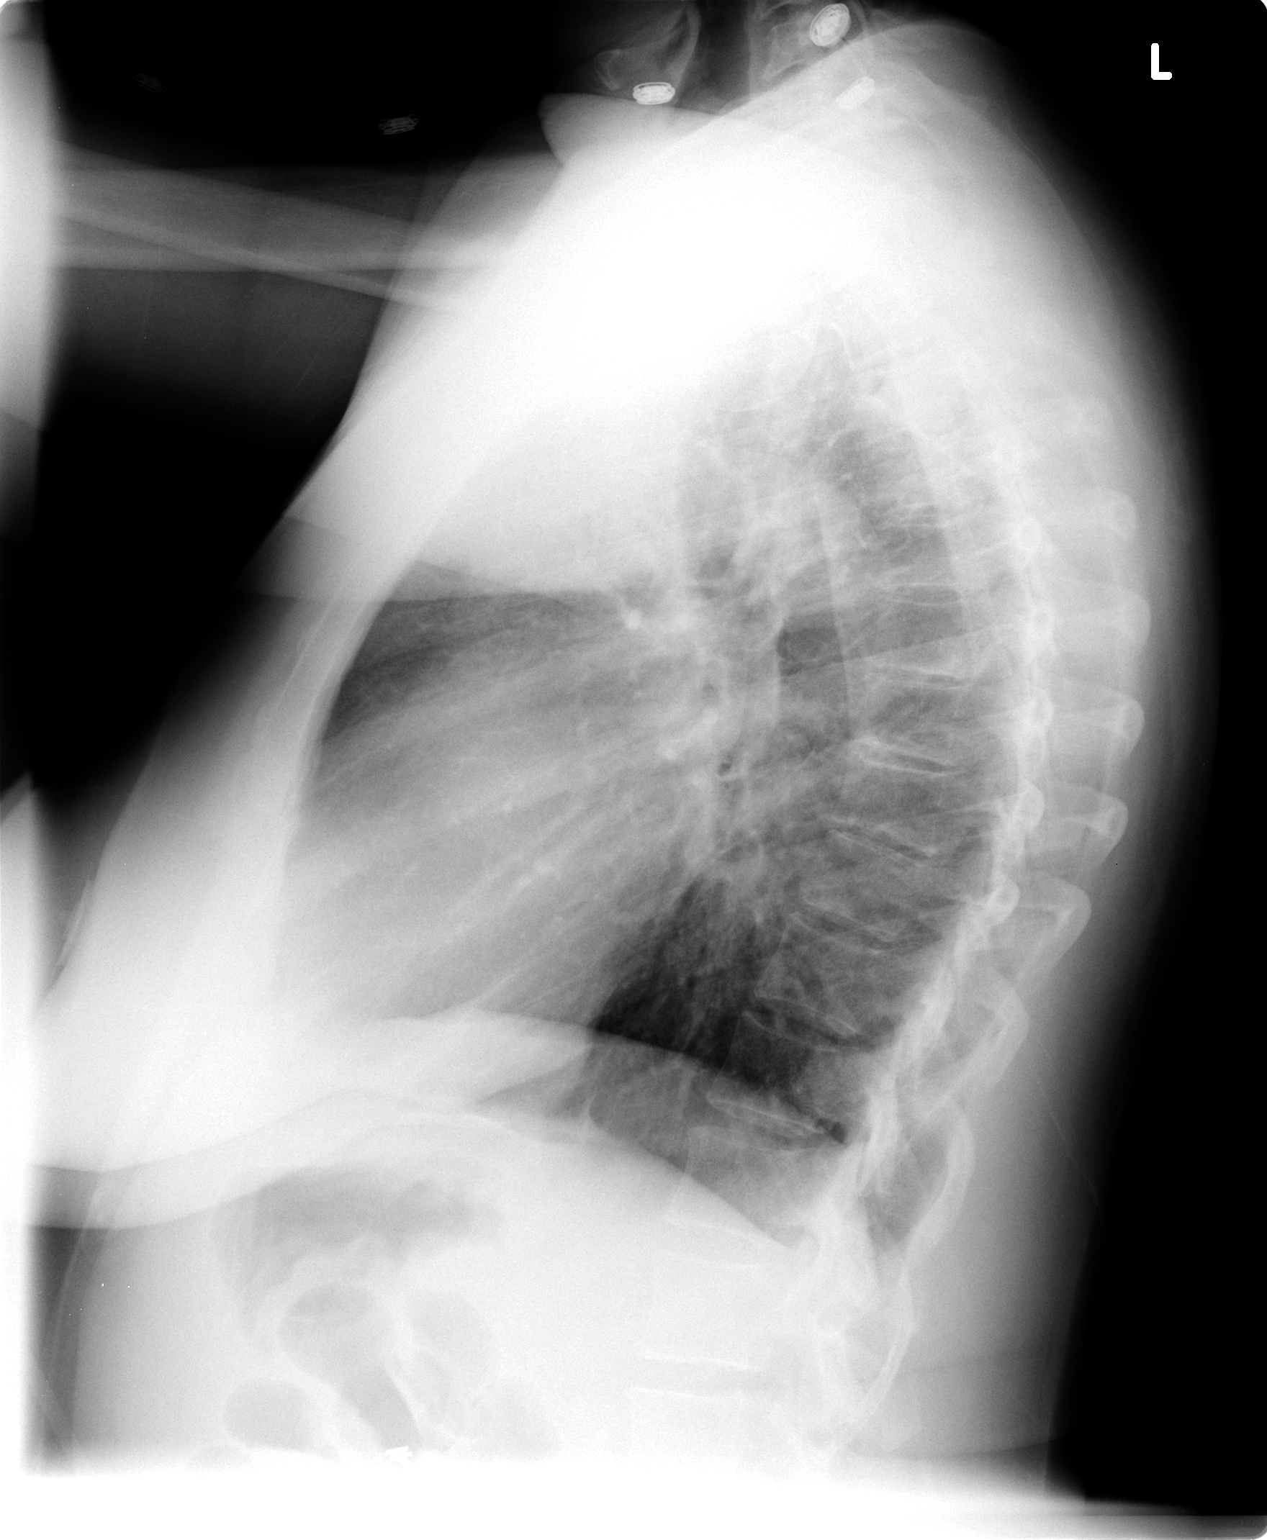

[2 of 2 positions shown; findings below may reference images not displayed]

FINDINGS: Heart and mediastinal contours are within normal limits.
There is some linear density extending to a focally thickened area
of pleura in the right upper lobe and seen only on the PA view. The
appearance is most suggestive of focal scarring.  The remainder of
the lung fields are clear with no signs of focal infiltrate or
congestive failure.  No focal nodularity is seen to suggest
metastatic disease in this patient with a history of colonic
cancer.

No pleural fluid or significant peribronchial cuffing is seen.

Bony structures are intact.

Surgical clips are identified in the upper abdomen on the lateral
view.
IMPRESSION: Probable scarring in the right upper lung zone.  If old films are
available for comparison, correlation would be recommended to
assess for stability of this finding.  If old films are not
available, repeat chest x-ray is recommended in 3 months to assess
for stability of this suspected area of scarring to confirm
stability given the patient's history of colon cancer.

## 2012-05-14 ENCOUNTER — Other Ambulatory Visit (HOSPITAL_COMMUNITY): Payer: Self-pay | Admitting: Family Medicine

## 2012-05-14 DIAGNOSIS — Z139 Encounter for screening, unspecified: Secondary | ICD-10-CM

## 2012-05-21 ENCOUNTER — Ambulatory Visit (HOSPITAL_COMMUNITY): Payer: Medicare Other

## 2012-05-22 ENCOUNTER — Ambulatory Visit (HOSPITAL_COMMUNITY)
Admission: RE | Admit: 2012-05-22 | Discharge: 2012-05-22 | Disposition: A | Discharge status: Home / Self Care | Payer: Medicare Other | Source: Ambulatory Visit | Attending: Family Medicine | Admitting: Family Medicine

## 2012-05-22 DIAGNOSIS — Z139 Encounter for screening, unspecified: Secondary | ICD-10-CM

## 2012-05-22 DIAGNOSIS — Z1231 Encounter for screening mammogram for malignant neoplasm of breast: Secondary | ICD-10-CM | POA: Insufficient documentation

## 2013-02-19 ENCOUNTER — Encounter: Payer: Self-pay | Admitting: Gastroenterology

## 2013-03-14 ENCOUNTER — Encounter: Payer: Self-pay | Admitting: Internal Medicine

## 2013-03-19 ENCOUNTER — Encounter: Payer: Self-pay | Admitting: Gastroenterology

## 2013-03-19 ENCOUNTER — Ambulatory Visit (INDEPENDENT_AMBULATORY_CARE_PROVIDER_SITE_OTHER): Payer: Medicare Other | Admitting: Gastroenterology

## 2013-03-19 VITALS — BP 130/77 | HR 105 | Temp 97.2°F | Ht 67.0 in | Wt 162.0 lb

## 2013-03-19 DIAGNOSIS — K439 Ventral hernia without obstruction or gangrene: Secondary | ICD-10-CM | POA: Insufficient documentation

## 2013-03-19 DIAGNOSIS — D509 Iron deficiency anemia, unspecified: Secondary | ICD-10-CM | POA: Insufficient documentation

## 2013-03-19 DIAGNOSIS — D126 Benign neoplasm of colon, unspecified: Secondary | ICD-10-CM | POA: Insufficient documentation

## 2013-03-19 DIAGNOSIS — K5900 Constipation, unspecified: Secondary | ICD-10-CM | POA: Insufficient documentation

## 2013-03-19 NOTE — Assessment & Plan Note (Signed)
Due for surveillance colonoscopy at this time.  I have discussed the risks, alternatives, benefits with regards to but not limited to the risk of reaction to medication, bleeding, infection, perforation and the patient is agreeable to proceed. Written consent to be obtained.  

## 2013-03-19 NOTE — Progress Notes (Signed)
CC PCP 

## 2013-03-19 NOTE — Assessment & Plan Note (Signed)
Review last few H/H, iron from PCP. Had blood work today as well. Further recommendations regarding iron supplementation once reviewed.

## 2013-03-19 NOTE — Assessment & Plan Note (Signed)
Offered referral to [facility] surgery. She would like to put off for couple months until she is able to move. She'll let us know when she is ready.

## 2013-03-19 NOTE — Patient Instructions (Addendum)
1. Take MiraLax 1-2 capsules daily as needed to have a soft bowel movement. 2. You may take Metamucil and or smooth move tea along with MiraLax as needed. 3. Hold your iron for one week prior to colonoscopy. 4. If you've not had a bowel movement the day before your bowel prep begins, please take a laxative as discussed. 5. Colonoscopy with Dr. Jena Gauss in the near future. Please see separate instructions. 6. Please call when you are ready for referral to [facility] surgery for abdominal wall hernia. 7. I will review copy of her last several blood counts/iron and make recommendations regarding ongoing iron therapy.

## 2013-03-19 NOTE — Progress Notes (Signed)
Primary Care Physician:  DONDIEGO,RICHARD M, MD  Primary Gastroenterologist:  Michael Rourk, MD   Chief Complaint  Patient presents with  . Colonoscopy    HPI:  Katelyn Powell is a 62 y.o. female here to schedule surveillance colonoscopy. In May 2011 she underwent EGD and colonoscopy for iron deficiency anemia. She had mild gastritis and a couple of nonbleeding duodenal AVMs but otherwise unremarkable upper GI tract. She had several tubular adenomas removed from her colon. She also had a rectal mass, tubulovillous adenoma. She underwent low anterior resection of rectal mass. Fortunately pathology revealed high-grade dysplasia but no adenocarcinoma. She had 5 negative lymph nodes. CT scan was performed prior to her surgery. Large mass in the rectum was seen. She a couple of the elongated. Aortic and right obturator lymph nodes (no pathologically enlarged lymph nodes) and a 4 x 4 by 4 mm lesion in the dome of the liver (indeterminate).  Constipation over past few years. Thinks it is the iron supplement. Has been on the iron for three years. Used to have BM once daily but now once per week. Will take Mag Citrate about once per week. Takes Metamucil, MiraLax, smooth cleanse herbal tea intermittently. No brbpr, melena. Lower abdominal pain, relieved with BM. Appetite normal. No unintentional weight loss. No heartburn. No dysphagia. ?small hernia in lower abdomen noted six months after surgery. Feels like it has grown since then. [employer] orange size. Wants to consider elective surgery in couple of months but wants to go to different surgeon.   Current Outpatient Prescriptions  Medication Sig Dispense Refill  . aspirin 81 MG tablet Take 81 mg by mouth daily.      . baclofen (LIORESAL) 10 MG tablet Take 10 mg by mouth at bedtime and may repeat dose one time if needed.       . cloNIDine (CATAPRES) 0.2 MG tablet Take 0.2 mg by mouth 2 (two) times daily.       . ferrous sulfate 325 (65 FE) MG tablet Take 325  mg by mouth daily with breakfast.      . lidocaine (XYLOCAINE) 5 % ointment Apply 1 application topically daily.       . lisinopril-hydrochlorothiazide (PRINZIDE,ZESTORETIC) 20-12.5 MG per tablet Take 1 tablet by mouth daily.       . metFORMIN (GLUCOPHAGE) 500 MG tablet Take 500 mg by mouth 2 (two) times daily with a meal.       . simvastatin (ZOCOR) 20 MG tablet Take 20 mg by mouth daily.       . traZODone (DESYREL) 50 MG tablet Take 50 mg by mouth at bedtime.        No current facility-administered medications for this visit.    Allergies as of 03/19/2013 - Review Complete 03/19/2013  Allergen Reaction Noted  . Keflin (cephalothin)  03/19/2013  . Other  03/19/2013  . Penicillins  03/19/2013    Past Medical History  Diagnosis Date  . Diabetes   . Hyperlipidemia   . Hypertension   . Fibromyalgia   . Depression   . Hx of adenomatous colonic polyps 2011    Low anterior resection of rectal mass, tubular villous adenoma with high-grade dysplasia  . Migraines   . Sleep apnea     Past Surgical History  Procedure Laterality Date  . Esophagogastroduodenoscopy  01/07/10    RMR: Small hiatal hernia, antral erosions, 1.5 cm area of raised mucosa in the antrum, 2 nonbleeding D2 AVMs. Biopsies unremarkable.  . Colonoscopy  01/07/10      RMR:rectal mass (tubulovillous adenoma with no carcinoma identified), multiple colonic polyps (tubular adenomas)/left-side transverse diverticula  . Low anterior bowel resection  May 2011    Rectal mass (tubular villous adenoma with no carcinoma identified. She had high-grade dysplasia. 5. Colonic lymph nodes were negative, resection margins negative.Jenkins/Ziegler  . Abdominal hysterectomy  2004  . Tubal ligation    . Cholecystectomy  1980  . Appendectomy  2003  . Knee arthroscopy  1197    left  . Breast biopsy  2000    left    Family History  Problem Relation Age of Onset  . Colon cancer Neg Hx     History   Social History  . Marital Status:  Divorced    Spouse Name: N/A    Number of Children: 3  . Years of Education: N/A   Occupational History  . Not on file.   Social History Main Topics  . Smoking status: Current Every Day Smoker -- 0.50 packs/day    Types: Cigarettes  . Smokeless tobacco: Not on file  . Alcohol Use: No  . Drug Use: No  . Sexually Active: Not on file   Other Topics Concern  . Not on file   Social History Narrative  . No narrative on file      ROS:  General: Negative for anorexia, weight loss, fever, chills, fatigue, weakness. Eyes: Negative for vision changes.  ENT: Negative for hoarseness, difficulty swallowing , nasal congestion. CV: Negative for chest pain, angina, palpitations, dyspnea on exertion, peripheral edema.  Respiratory: Negative for dyspnea at rest, dyspnea on exertion, cough, sputum, wheezing.  GI: See history of present illness. GU:  Negative for dysuria, hematuria, urinary incontinence, urinary frequency, nocturnal urination.  MS: Negative for joint pain, low back pain.  Derm: Negative for rash or itching.  Neuro: Negative for weakness, abnormal sensation, seizure, frequent headaches, memory loss, confusion.  Psych: Negative for anxiety, depression, suicidal ideation, hallucinations.  Endo: Negative for unusual weight change.  Heme: Negative for bruising or bleeding. Allergy: Negative for rash or hives.    Physical Examination:  BP 130/77  Pulse 105  Temp(Src) 97.2 F (36.2 C) (Oral)  Ht 5' 7" (1.702 m)  Wt 162 lb (73.483 kg)  BMI 25.37 kg/m2   General: Well-nourished, well-developed in no acute distress.  Head: Normocephalic, atraumatic.   Eyes: Conjunctiva pink, no icterus. Mouth: Oropharyngeal mucosa moist and pink , no lesions erythema or exudate. Neck: Supple without thyromegaly, masses, or lymphadenopathy.  Lungs: Clear to auscultation bilaterally.  Heart: Regular rate and rhythm, no murmurs rubs or gallops.  Abdomen: Bowel sounds are normal, nontender,  nondistended, no hepatosplenomegaly or masses, no abdominal bruits, no rebound or guarding. Orange size lower mid-line hernia easily reducible and nontender.   Rectal: deferred Extremities: No lower extremity edema. No clubbing or deformities.  Neuro: Alert and oriented x 4 , grossly normal neurologically.  Skin: Warm and dry, no rash or jaundice.   Psych: Alert and cooperative, normal mood and affect.    

## 2013-03-19 NOTE — Assessment & Plan Note (Signed)
1. Take MiraLax 1-2 capsules daily as needed to have a soft bowel movement. 2. You may take Metamucil and or smooth move tea along with MiraLax as needed.  3. Colonoscopy as planned.

## 2013-03-20 ENCOUNTER — Other Ambulatory Visit: Payer: Self-pay | Admitting: Internal Medicine

## 2013-03-20 MED ORDER — PEG 3350-KCL-NA BICARB-NACL 420 G PO SOLR: 4000.0000 mL | ORAL | Status: DC

## 2013-03-28 NOTE — Progress Notes (Signed)
Received a copy of labs from February 2014 from PCP. BUN 10, creatinine 0.71, total bilirubin 0.3, alkaline phosphatase 84, AST 17, ALT 23, albumin 4.7.  Patient states she had lab work on 03/19/2013.  Please request lab results from 03/19/2013.

## 2013-03-29 NOTE — Progress Notes (Signed)
Darl Pikes please try to get recent labs.

## 2013-04-01 ENCOUNTER — Encounter (HOSPITAL_COMMUNITY): Payer: Self-pay | Admitting: Pharmacy Technician

## 2013-04-01 NOTE — Progress Notes (Signed)
Requested labs done on 7/15 from PCP

## 2013-04-04 ENCOUNTER — Ambulatory Visit (HOSPITAL_COMMUNITY)
Admission: RE | Admit: 2013-04-04 | Discharge: 2013-04-04 | Disposition: A | Discharge status: Home / Self Care | Payer: Medicare Other | Source: Ambulatory Visit | Attending: Internal Medicine | Admitting: Internal Medicine

## 2013-04-04 ENCOUNTER — Encounter (HOSPITAL_COMMUNITY)
Admission: RE | Disposition: A | Discharge status: Home / Self Care | Payer: Self-pay | Source: Ambulatory Visit | Attending: Internal Medicine

## 2013-04-04 ENCOUNTER — Encounter (HOSPITAL_COMMUNITY): Payer: Self-pay

## 2013-04-04 DIAGNOSIS — F17200 Nicotine dependence, unspecified, uncomplicated: Secondary | ICD-10-CM | POA: Insufficient documentation

## 2013-04-04 DIAGNOSIS — K648 Other hemorrhoids: Secondary | ICD-10-CM

## 2013-04-04 DIAGNOSIS — F3289 Other specified depressive episodes: Secondary | ICD-10-CM | POA: Insufficient documentation

## 2013-04-04 DIAGNOSIS — K633 Ulcer of intestine: Secondary | ICD-10-CM

## 2013-04-04 DIAGNOSIS — Z79899 Other long term (current) drug therapy: Secondary | ICD-10-CM | POA: Insufficient documentation

## 2013-04-04 DIAGNOSIS — Z9049 Acquired absence of other specified parts of digestive tract: Secondary | ICD-10-CM | POA: Insufficient documentation

## 2013-04-04 DIAGNOSIS — E119 Type 2 diabetes mellitus without complications: Secondary | ICD-10-CM | POA: Insufficient documentation

## 2013-04-04 DIAGNOSIS — Z881 Allergy status to other antibiotic agents status: Secondary | ICD-10-CM | POA: Insufficient documentation

## 2013-04-04 DIAGNOSIS — K5730 Diverticulosis of large intestine without perforation or abscess without bleeding: Secondary | ICD-10-CM | POA: Insufficient documentation

## 2013-04-04 DIAGNOSIS — K439 Ventral hernia without obstruction or gangrene: Secondary | ICD-10-CM | POA: Insufficient documentation

## 2013-04-04 DIAGNOSIS — G43909 Migraine, unspecified, not intractable, without status migrainosus: Secondary | ICD-10-CM | POA: Insufficient documentation

## 2013-04-04 DIAGNOSIS — K5900 Constipation, unspecified: Secondary | ICD-10-CM

## 2013-04-04 DIAGNOSIS — Z880 Allergy status to penicillin: Secondary | ICD-10-CM | POA: Insufficient documentation

## 2013-04-04 DIAGNOSIS — Z1211 Encounter for screening for malignant neoplasm of colon: Secondary | ICD-10-CM

## 2013-04-04 DIAGNOSIS — I10 Essential (primary) hypertension: Secondary | ICD-10-CM | POA: Insufficient documentation

## 2013-04-04 DIAGNOSIS — Z860100 Personal history of colon polyps, unspecified: Secondary | ICD-10-CM | POA: Insufficient documentation

## 2013-04-04 DIAGNOSIS — G4730 Sleep apnea, unspecified: Secondary | ICD-10-CM | POA: Insufficient documentation

## 2013-04-04 DIAGNOSIS — IMO0001 Reserved for inherently not codable concepts without codable children: Secondary | ICD-10-CM | POA: Insufficient documentation

## 2013-04-04 DIAGNOSIS — D126 Benign neoplasm of colon, unspecified: Secondary | ICD-10-CM

## 2013-04-04 DIAGNOSIS — E785 Hyperlipidemia, unspecified: Secondary | ICD-10-CM | POA: Insufficient documentation

## 2013-04-04 DIAGNOSIS — Z7982 Long term (current) use of aspirin: Secondary | ICD-10-CM | POA: Insufficient documentation

## 2013-04-04 DIAGNOSIS — Z888 Allergy status to other drugs, medicaments and biological substances status: Secondary | ICD-10-CM | POA: Insufficient documentation

## 2013-04-04 DIAGNOSIS — F329 Major depressive disorder, single episode, unspecified: Secondary | ICD-10-CM | POA: Insufficient documentation

## 2013-04-04 DIAGNOSIS — D509 Iron deficiency anemia, unspecified: Secondary | ICD-10-CM | POA: Insufficient documentation

## 2013-04-04 HISTORY — PX: COLONOSCOPY: SHX5424

## 2013-04-04 SURGERY — COLONOSCOPY
Anesthesia: Moderate Sedation

## 2013-04-04 MED ORDER — MEPERIDINE HCL 100 MG/ML IJ SOLN
INTRAMUSCULAR | Status: AC
  Filled 2013-04-04: qty 2

## 2013-04-04 MED ORDER — MEPERIDINE HCL 50 MG/ML IJ SOLN
INTRAMUSCULAR | Status: AC
  Filled 2013-04-04: qty 1

## 2013-04-04 MED ORDER — SODIUM CHLORIDE 0.9 % IV SOLN
INTRAVENOUS | Status: DC
  Administered 2013-04-04: 09:00:00 via INTRAVENOUS

## 2013-04-04 MED ORDER — MEPERIDINE HCL 100 MG/ML IJ SOLN
INTRAMUSCULAR | Status: DC | PRN
  Administered 2013-04-04: 25 mg via INTRAVENOUS
  Administered 2013-04-04: 50 mg via INTRAVENOUS

## 2013-04-04 MED ORDER — ONDANSETRON HCL 4 MG/2ML IJ SOLN
INTRAMUSCULAR | Status: DC | PRN
  Administered 2013-04-04: 4 mg via INTRAVENOUS

## 2013-04-04 MED ORDER — STERILE WATER FOR IRRIGATION IR SOLN
Status: DC | PRN
  Administered 2013-04-04: 10:00:00

## 2013-04-04 MED ORDER — MIDAZOLAM HCL 5 MG/5ML IJ SOLN
INTRAMUSCULAR | Status: AC
  Filled 2013-04-04: qty 10

## 2013-04-04 MED ORDER — ONDANSETRON HCL 4 MG/2ML IJ SOLN
INTRAMUSCULAR | Status: AC
  Filled 2013-04-04: qty 2

## 2013-04-04 MED ORDER — MIDAZOLAM HCL 5 MG/5ML IJ SOLN
INTRAMUSCULAR | Status: DC | PRN
  Administered 2013-04-04 (×2): 1 mg via INTRAVENOUS
  Administered 2013-04-04: 2 mg via INTRAVENOUS

## 2013-04-04 NOTE — Op Note (Signed)
[facility] [address] [place], [address]   COLONOSCOPY PROCEDURE REPORT  PATIENT: [name], [name]  MR#:         [id number] BIRTHDATE: 18-Jan-1951 , 62  yrs. old GENDER: Female ENDOSCOPIST: R.  Roetta Sessions, MD FACP [facility] REFERRED BY:  Oval Linsey, M.D. PROCEDURE DATE:  04/04/2013 PROCEDURE:     ileocolonoscopy with biopsy  INDICATIONS: status post low anterior resection for high-grade rectal adenoma;  Here for surveillance examination.  INFORMED CONSENT:  The risks, benefits, alternatives and imponderables including but not limited to bleeding, perforation as well as the possibility of a missed lesion have been reviewed.  The potential for biopsy, lesion removal, etc. have also been discussed.  Questions have been answered.  All parties agreeable. Please see the history and physical in the medical record for more information.  MEDICATIONS: Versed 4 mg IV and Demerol 75 mg IV in divided doses. Zofran 4 mg IV.  DESCRIPTION OF PROCEDURE:  After a digital rectal exam was performed, the EC-[id number] ([id number])  colonoscope was advanced from the anus through the rectum and colon to the area of the cecum, ileocecal valve and appendiceal orifice.  The cecum was deeply intubated.  These structures were well-seen and photographed for the record.  From the level of the cecum and ileocecal valve, the scope was slowly and cautiously withdrawn.  The mucosal surfaces were carefully surveyed utilizing scope tip deflection to facilitate fold flattening as needed.  The scope was pulled down into the rectum where a thorough examination including retroflexion was performed.    FINDINGS:  Internal hemorrhoids; surgical anastomosis 10 cm by anal verge. Residual rectal mucosa otherwise appeared normal.: Long and redundant. Scattered pancolonic small mouth diverticula. At the ileocecal valve was approximately a 1.25 cm x 9 mm ulcer-on the distal side of ileocecal valve.  Please see photos. This did not appear to be neoplastic. Otherwise, the remainder of the residual rectal mucosa appeared normal aside from a mid ascending colon lipoma (positive pillow sign).. The terminal ileum was intubated for  5 cm and a segment of GI tract appeared normal.  THERAPEUTIC / DIAGNOSTIC MANEUVERS PERFORMED:  The ulcer mentioned above was biopsied for histologic study.  COMPLICATIONS: none  CECAL WITHDRAWAL TIME:  25 minutes  IMPRESSION:  colonic diverticulosis. Colonic ulcer-status post biopsy  RECOMMENDATIONS: Followup on pathology.   _______________________________ eSigned:  R. Roetta Sessions, MD FACP [facility] 04/04/2013 10:58 AM   CC:    PATIENT NAME:  Katelyn Powell, Katelyn Powell MR#: [id number]

## 2013-04-04 NOTE — H&P (View-Only) (Signed)
Primary Care Physician:  Isabella Stalling, MD  Primary Gastroenterologist:  Roetta Sessions, MD   Chief Complaint  Patient presents with  . Colonoscopy    HPI:  Katelyn Powell is a 62 y.o. female here to schedule surveillance colonoscopy. In May 2011 she underwent EGD and colonoscopy for iron deficiency anemia. She had mild gastritis and a couple of nonbleeding duodenal AVMs but otherwise unremarkable upper GI tract. She had several tubular adenomas removed from her colon. She also had a rectal mass, tubulovillous adenoma. She underwent low anterior resection of rectal mass. Fortunately pathology revealed high-grade dysplasia but no adenocarcinoma. She had 5 negative lymph nodes. CT scan was performed prior to her surgery. Large mass in the rectum was seen. She a couple of the elongated. Aortic and right obturator lymph nodes (no pathologically enlarged lymph nodes) and a 4 x 4 by 4 mm lesion in the dome of the liver (indeterminate).  Constipation over past few years. Thinks it is the iron supplement. Has been on the iron for three years. Used to have BM once daily but now once per week. Will take Mag Citrate about once per week. Takes Metamucil, MiraLax, smooth cleanse herbal tea intermittently. No brbpr, melena. Lower abdominal pain, relieved with BM. Appetite normal. No unintentional weight loss. No heartburn. No dysphagia. ?small hernia in lower abdomen noted six months after surgery. Feels like it has grown since then. [employer] orange size. Wants to consider elective surgery in couple of months but wants to go to different surgeon.   Current Outpatient Prescriptions  Medication Sig Dispense Refill  . aspirin 81 MG tablet Take 81 mg by mouth daily.      . baclofen (LIORESAL) 10 MG tablet Take 10 mg by mouth at bedtime and may repeat dose one time if needed.       . cloNIDine (CATAPRES) 0.2 MG tablet Take 0.2 mg by mouth 2 (two) times daily.       . ferrous sulfate 325 (65 FE) MG tablet Take 325  mg by mouth daily with breakfast.      . lidocaine (XYLOCAINE) 5 % ointment Apply 1 application topically daily.       Marland Kitchen lisinopril-hydrochlorothiazide (PRINZIDE,ZESTORETIC) 20-12.5 MG per tablet Take 1 tablet by mouth daily.       . metFORMIN (GLUCOPHAGE) 500 MG tablet Take 500 mg by mouth 2 (two) times daily with a meal.       . simvastatin (ZOCOR) 20 MG tablet Take 20 mg by mouth daily.       . traZODone (DESYREL) 50 MG tablet Take 50 mg by mouth at bedtime.        No current facility-administered medications for this visit.    Allergies as of 03/19/2013 - Review Complete 03/19/2013  Allergen Reaction Noted  . Keflin (cephalothin)  03/19/2013  . Other  03/19/2013  . Penicillins  03/19/2013    Past Medical History  Diagnosis Date  . Diabetes   . Hyperlipidemia   . Hypertension   . Fibromyalgia   . Depression   . Hx of adenomatous colonic polyps 2011    Low anterior resection of rectal mass, tubular villous adenoma with high-grade dysplasia  . Migraines   . Sleep apnea     Past Surgical History  Procedure Laterality Date  . Esophagogastroduodenoscopy  01/07/10    RMR: Small hiatal hernia, antral erosions, 1.5 cm area of raised mucosa in the antrum, 2 nonbleeding D2 AVMs. Biopsies unremarkable.  . Colonoscopy  01/07/10  ZOX:WRUEAV mass (tubulovillous adenoma with no carcinoma identified), multiple colonic polyps (tubular adenomas)/left-side transverse diverticula  . Low anterior bowel resection  May 2011    Rectal mass (tubular villous adenoma with no carcinoma identified. She had high-grade dysplasia. 5. Colonic lymph nodes were negative, resection margins negative.Jenkins/Ziegler  . Abdominal hysterectomy  2004  . Tubal ligation    . Cholecystectomy  1980  . Appendectomy  2003  . Knee arthroscopy  1197    left  . Breast biopsy  2000    left    Family History  Problem Relation Age of Onset  . Colon cancer Neg Hx     History   Social History  . Marital Status:  Divorced    Spouse Name: N/A    Number of Children: 3  . Years of Education: N/A   Occupational History  . Not on file.   Social History Main Topics  . Smoking status: Current Every Day Smoker -- 0.50 packs/day    Types: Cigarettes  . Smokeless tobacco: Not on file  . Alcohol Use: No  . Drug Use: No  . Sexually Active: Not on file   Other Topics Concern  . Not on file   Social History Narrative  . No narrative on file      ROS:  General: Negative for anorexia, weight loss, fever, chills, fatigue, weakness. Eyes: Negative for vision changes.  ENT: Negative for hoarseness, difficulty swallowing , nasal congestion. CV: Negative for chest pain, angina, palpitations, dyspnea on exertion, peripheral edema.  Respiratory: Negative for dyspnea at rest, dyspnea on exertion, cough, sputum, wheezing.  GI: See history of present illness. GU:  Negative for dysuria, hematuria, urinary incontinence, urinary frequency, nocturnal urination.  MS: Negative for joint pain, low back pain.  Derm: Negative for rash or itching.  Neuro: Negative for weakness, abnormal sensation, seizure, frequent headaches, memory loss, confusion.  Psych: Negative for anxiety, depression, suicidal ideation, hallucinations.  Endo: Negative for unusual weight change.  Heme: Negative for bruising or bleeding. Allergy: Negative for rash or hives.    Physical Examination:  BP 130/77  Pulse 105  Temp(Src) 97.2 F (36.2 C) (Oral)  Ht 5\' 7"  (1.702 m)  Wt 162 lb (73.483 kg)  BMI 25.37 kg/m2   General: Well-nourished, well-developed in no acute distress.  Head: Normocephalic, atraumatic.   Eyes: Conjunctiva pink, no icterus. Mouth: Oropharyngeal mucosa moist and pink , no lesions erythema or exudate. Neck: Supple without thyromegaly, masses, or lymphadenopathy.  Lungs: Clear to auscultation bilaterally.  Heart: Regular rate and rhythm, no murmurs rubs or gallops.  Abdomen: Bowel sounds are normal, nontender,  nondistended, no hepatosplenomegaly or masses, no abdominal bruits, no rebound or guarding. Orange size lower mid-line hernia easily reducible and nontender.   Rectal: deferred Extremities: No lower extremity edema. No clubbing or deformities.  Neuro: Alert and oriented x 4 , grossly normal neurologically.  Skin: Warm and dry, no rash or jaundice.   Psych: Alert and cooperative, normal mood and affect.

## 2013-04-04 NOTE — Interval H&P Note (Signed)
History and Physical Interval Note:  04/04/2013 10:05 AM  Dionne Ano  has presented today for surgery, with the diagnosis of ADVANCED COLON POLYPS, IDA, CONSTIPATION, AN ABDOMINAL WALL HERNIA  The various methods of treatment have been discussed with the patient and family. After consideration of risks, benefits and other options for treatment, the patient has consented to  Procedure(s) with comments: COLONOSCOPY (N/A) - 10:15 as a surgical intervention .  The patient's history has been reviewed, patient examined, no change in status, stable for surgery.  I have reviewed the patient's chart and labs.  Questions were answered to the patient's satisfaction.      No change. Chronic constipation. Surveillance colonoscopy per plan.The risks, benefits, limitations, alternatives and imponderables have been reviewed with the patient. Questions have been answered. All parties are agreeable.    Eula Listen

## 2013-04-05 ENCOUNTER — Encounter (HOSPITAL_COMMUNITY): Payer: Self-pay | Admitting: Internal Medicine

## 2013-04-06 ENCOUNTER — Encounter: Payer: Self-pay | Admitting: Internal Medicine

## 2013-04-08 ENCOUNTER — Other Ambulatory Visit: Payer: Self-pay

## 2013-04-08 ENCOUNTER — Telehealth: Payer: Self-pay

## 2013-04-08 DIAGNOSIS — D509 Iron deficiency anemia, unspecified: Secondary | ICD-10-CM

## 2013-04-08 NOTE — Telephone Encounter (Signed)
Letter from: Corbin Ade   Reason for Letter: Results Review   Send letter to patient.  Send copy of letter with path to referring provider and PCP.  Raynelle Fanning, pt needs a cbc next week       Letter and lab order mailed to pt. Benedetto Goad, please cc pcp Darl Pikes, please nic

## 2013-04-08 NOTE — Progress Notes (Signed)
Reminder in epic °

## 2013-04-08 NOTE — Telephone Encounter (Signed)
Reminder in epic °

## 2013-04-08 NOTE — Progress Notes (Unsigned)
Patient ID: Katelyn Powell, female   DOB: 1951/09/01, 62 y.o.   MRN: [id number] Letter from: Corbin Ade Reason for Letter: Results Review Send letter to patient.  Send copy of letter with path to referring provider and PCP.  Raynelle Fanning, pt needs a cbc next week  Letter and lab order in mail to pt. Benedetto Goad, please cc pcp Darl Pikes, please nic

## 2013-04-10 NOTE — Progress Notes (Signed)
Reviewed labs from 03/19/2013. White blood cell count 6100, hemoglobin 12.2, hematocrit 39.2, MCV 88.1, platelets 318,000, total bilirubin 0.4, alkaline phosphatase 98, AST 15, ALT is 26, albumin 4.4.

## 2013-04-11 LAB — CBC AND DIFFERENTIAL
ALT(SGPT): 26
AST: 15 U/L
Albumin: 4.4
Alkaline Phosphatase: 98 U/L
HCT: 39 %
Hemoglobin Evaluation: 12.2
MCV: 88.1 fL
Platelets: 318 10*3/uL
Total Bilirubin: 0.4 mg/dL
WBC: 6.1

## 2013-04-15 LAB — CBC WITH DIFFERENTIAL/PLATELET
Basophils Relative: 0 % (ref 0–1)
Eosinophils Absolute: 0.2 10*3/uL (ref 0.0–0.7)
Eosinophils Relative: 4 % (ref 0–5)
HCT: 36.4 % (ref 36.0–46.0)
Hemoglobin: 11.7 g/dL — ABNORMAL LOW (ref 12.0–15.0)
Lymphs Abs: 1.2 10*3/uL (ref 0.7–4.0)
MCH: 27.7 pg (ref 26.0–34.0)
MCHC: 32.1 g/dL (ref 30.0–36.0)
MCV: 86.1 fL (ref 78.0–100.0)
Monocytes Absolute: 0.4 10*3/uL (ref 0.1–1.0)
Monocytes Relative: 8 % (ref 3–12)
RBC: 4.23 MIL/uL (ref 3.87–5.11)

## 2013-04-17 LAB — COMPREHENSIVE METABOLIC PANEL
ALT: 23 U/L (ref 7–35)
AST: 17 U/L
Albumin: 4.7
Creat: 0.71

## 2013-04-18 ENCOUNTER — Other Ambulatory Visit: Payer: Self-pay

## 2013-04-18 DIAGNOSIS — D509 Iron deficiency anemia, unspecified: Secondary | ICD-10-CM

## 2013-06-04 ENCOUNTER — Telehealth: Payer: Self-pay | Admitting: Gastroenterology

## 2013-06-04 NOTE — Telephone Encounter (Signed)
Please let patient know, after colonoscopy and path/labs came back and were addressed, I was looking over her records again. Discussed with Dr. Jena Gauss. It is not clear the best recommendations for this, but she had a TINY liver lesion on the CT done in 2011 just prior to her colon surgery. Likely insignificant but often we follow up on these to ensure stability.   At this point, we would recommend CT Abd Liver protocol to follow-up/ensure stability of liver lesion if she is agreeable.

## 2013-06-06 NOTE — Telephone Encounter (Signed)
Tried to call pt- LMOM 

## 2013-06-14 NOTE — Telephone Encounter (Signed)
Let's try again to contact patient via phone or letter.

## 2013-06-18 NOTE — Telephone Encounter (Signed)
Tried to call pt- LMOM 

## 2013-06-24 NOTE — Telephone Encounter (Signed)
Mailed letter to pt asking her to call the office.

## 2013-06-28 ENCOUNTER — Other Ambulatory Visit: Payer: Self-pay | Admitting: Gastroenterology

## 2013-06-28 DIAGNOSIS — K769 Liver disease, unspecified: Secondary | ICD-10-CM

## 2013-06-28 NOTE — Telephone Encounter (Signed)
Pt is aware and she stated it was ok to set up ct.  Benedetto Goad, please schedule. Her phone number is [phone].

## 2013-06-28 NOTE — Telephone Encounter (Signed)
Pt is scheduled for CT on Thursday Oct 30th at 8:30 and she is aware to have Creat drawn as well as to pick up Contrast

## 2013-07-02 ENCOUNTER — Other Ambulatory Visit: Payer: Self-pay

## 2013-07-02 DIAGNOSIS — D509 Iron deficiency anemia, unspecified: Secondary | ICD-10-CM

## 2013-07-04 ENCOUNTER — Encounter (HOSPITAL_COMMUNITY): Payer: Self-pay

## 2013-07-04 ENCOUNTER — Ambulatory Visit (HOSPITAL_COMMUNITY)
Admission: RE | Admit: 2013-07-04 | Discharge: 2013-07-04 | Disposition: A | Discharge status: Home / Self Care | Payer: Medicare Other | Source: Ambulatory Visit | Attending: Gastroenterology | Admitting: Gastroenterology

## 2013-07-04 DIAGNOSIS — K7689 Other specified diseases of liver: Secondary | ICD-10-CM | POA: Insufficient documentation

## 2013-07-04 DIAGNOSIS — N200 Calculus of kidney: Secondary | ICD-10-CM | POA: Insufficient documentation

## 2013-07-04 DIAGNOSIS — Z85048 Personal history of other malignant neoplasm of rectum, rectosigmoid junction, and anus: Secondary | ICD-10-CM | POA: Insufficient documentation

## 2013-07-04 DIAGNOSIS — K769 Liver disease, unspecified: Secondary | ICD-10-CM

## 2013-07-04 MED ORDER — IOHEXOL 300 MG/ML  SOLN
100.0000 mL | Freq: Once | INTRAMUSCULAR | Status: AC | PRN
  Administered 2013-07-04: 100 mL via INTRAVENOUS

## 2013-07-16 ENCOUNTER — Other Ambulatory Visit (HOSPITAL_COMMUNITY): Payer: Self-pay | Admitting: Family Medicine

## 2013-07-16 DIAGNOSIS — Z139 Encounter for screening, unspecified: Secondary | ICD-10-CM

## 2013-07-17 NOTE — Progress Notes (Signed)
Quick Note:  Please let patient know that her CT showed no liver lesions!. Previous abnormality seen is no longer present. She has small right kidney stones that is nonobstruction. ______

## 2013-07-23 ENCOUNTER — Ambulatory Visit (HOSPITAL_COMMUNITY): Payer: Medicare Other

## 2013-07-25 ENCOUNTER — Ambulatory Visit (HOSPITAL_COMMUNITY)
Admission: RE | Admit: 2013-07-25 | Discharge: 2013-07-25 | Disposition: A | Discharge status: Home / Self Care | Payer: Medicare Other | Source: Ambulatory Visit | Attending: Family Medicine | Admitting: Family Medicine

## 2013-07-25 DIAGNOSIS — Z1231 Encounter for screening mammogram for malignant neoplasm of breast: Secondary | ICD-10-CM | POA: Insufficient documentation

## 2013-07-25 DIAGNOSIS — Z139 Encounter for screening, unspecified: Secondary | ICD-10-CM

## 2014-01-31 ENCOUNTER — Other Ambulatory Visit (HOSPITAL_COMMUNITY): Payer: Self-pay

## 2014-01-31 DIAGNOSIS — G4730 Sleep apnea, unspecified: Secondary | ICD-10-CM

## 2014-02-10 ENCOUNTER — Ambulatory Visit: Payer: Medicare Other | Attending: Neurology | Admitting: Sleep Medicine

## 2014-02-10 VITALS — Ht 67.0 in | Wt 165.0 lb

## 2014-02-10 DIAGNOSIS — G4730 Sleep apnea, unspecified: Secondary | ICD-10-CM | POA: Insufficient documentation

## 2014-02-10 DIAGNOSIS — R0609 Other forms of dyspnea: Secondary | ICD-10-CM | POA: Insufficient documentation

## 2014-02-10 DIAGNOSIS — Z7982 Long term (current) use of aspirin: Secondary | ICD-10-CM | POA: Diagnosis not present

## 2014-02-10 DIAGNOSIS — G4700 Insomnia, unspecified: Secondary | ICD-10-CM | POA: Diagnosis not present

## 2014-02-10 DIAGNOSIS — Z79899 Other long term (current) drug therapy: Secondary | ICD-10-CM | POA: Insufficient documentation

## 2014-02-10 DIAGNOSIS — R5383 Other fatigue: Principal | ICD-10-CM

## 2014-02-10 DIAGNOSIS — R5381 Other malaise: Secondary | ICD-10-CM | POA: Insufficient documentation

## 2014-02-10 DIAGNOSIS — R0989 Other specified symptoms and signs involving the circulatory and respiratory systems: Secondary | ICD-10-CM | POA: Insufficient documentation

## 2014-02-12 NOTE — Sleep Study (Signed)
  Katelyn A. Merlene Laughter, MD     [email or web address]        NOCTURNAL POLYSOMNOGRAM    LOCATION: SLEEP LAB FACILITY: [facility]   PHYSICIAN: Cahlil Sattar A. Merlene Powell, M.D.   DATE OF STUDY: 02/10/2014.   REFERRING PHYSICIAN: Jonpaul Lumm.  INDICATIONS: This is a 63 year old female who presents with snoring, insomnia and fatigue.  MEDICATIONS:  Prior to Admission medications   Medication Sig Start Date End Date Taking? Authorizing Provider  aspirin 81 MG tablet Take 81 mg by mouth daily.    Historical Provider, MD  aspirin-acetaminophen-caffeine (EXCEDRIN MIGRAINE) [phone] MG per tablet Take 1 tablet by mouth every 6 (six) hours as needed for pain.    Historical Provider, MD  baclofen (LIORESAL) 10 MG tablet Take 10 mg by mouth at bedtime and may repeat dose one time if needed.  03/06/13   Historical Provider, MD  Cholecalciferol (VITAMIN D PO) Take 1 tablet by mouth daily.    Historical Provider, MD  cloNIDine (CATAPRES) 0.2 MG tablet Take 0.2 mg by mouth 2 (two) times daily.  03/06/13   Historical Provider, MD  ferrous sulfate 325 (65 FE) MG tablet Take 325 mg by mouth daily with breakfast.    Historical Provider, MD  fish oil-omega-3 fatty acids 1000 MG capsule Take 1 g by mouth daily.    Historical Provider, MD  GARLIC PO Take 1 tablet by mouth daily.    Historical Provider, MD  hydrOXYzine (ATARAX/VISTARIL) 25 MG tablet Take 25 mg by mouth at bedtime.    Historical Provider, MD  lidocaine (XYLOCAINE) 5 % ointment Apply 1 application topically daily as needed (Pain).  03/06/13   Historical Provider, MD  lisinopril-hydrochlorothiazide (PRINZIDE,ZESTORETIC) 20-12.5 MG per tablet Take 1 tablet by mouth daily.  01/03/13   Historical Provider, MD  metFORMIN (GLUCOPHAGE) 500 MG tablet Take 500 mg by mouth 2 (two) times daily with a meal.  03/06/13   Historical Provider, MD  polyethylene glycol-electrolytes (TRILYTE) 420 G solution Take 4,000 mLs by mouth as directed. 03/20/13   Daneil Dolin, MD  simvastatin (ZOCOR) 20 MG tablet Take 20 mg by mouth daily.  01/03/13   Historical Provider, MD  traZODone (DESYREL) 50 MG tablet Take 50 mg by mouth at bedtime.  03/06/13   Historical Provider, MD      EPWORTH SLEEPINESS SCALE: 3.   BMI: 26.   ARCHITECTURAL SUMMARY: Total recording time was 413 minutes. Sleep efficiency 77 %. Sleep latency 28 minutes. REM latency 198 minutes. Stage NI 2 %, N2 32 % and N3 62 % and REM sleep 5 %.    RESPIRATORY DATA:  Baseline oxygen saturation is 96 %. The lowest saturation is 91 %. The diagnostic AHI is 0. The RDI is 0.   LIMB MOVEMENT SUMMARY: PLM index 0.   ELECTROCARDIOGRAM SUMMARY: Average heart rate is 76 with no significant dysrhythmias observed.   IMPRESSION:  1. Unremarkable nocturnal polysomnography.  Thanks for this referral.  Ming Mcmannis A. Merlene Powell, M.D. Diplomat, Tax adviser of Sleep Medicine.

## 2014-10-23 ENCOUNTER — Other Ambulatory Visit (HOSPITAL_COMMUNITY): Payer: Self-pay | Admitting: Family Medicine

## 2014-10-23 DIAGNOSIS — Z1231 Encounter for screening mammogram for malignant neoplasm of breast: Secondary | ICD-10-CM

## 2014-11-07 ENCOUNTER — Ambulatory Visit (HOSPITAL_COMMUNITY)
Admission: RE | Admit: 2014-11-07 | Discharge: 2014-11-07 | Disposition: A | Discharge status: Home / Self Care | Payer: Medicare Other | Source: Ambulatory Visit | Attending: Family Medicine | Admitting: Family Medicine

## 2014-11-07 DIAGNOSIS — Z1231 Encounter for screening mammogram for malignant neoplasm of breast: Secondary | ICD-10-CM | POA: Insufficient documentation

## 2017-11-13 ENCOUNTER — Ambulatory Visit (HOSPITAL_COMMUNITY)
Admission: RE | Admit: 2017-11-13 | Discharge: 2017-11-13 | Disposition: A | Discharge status: Home / Self Care | Payer: Medicare HMO | Source: Ambulatory Visit | Attending: Family Medicine | Admitting: Family Medicine

## 2017-11-13 ENCOUNTER — Other Ambulatory Visit (HOSPITAL_COMMUNITY): Payer: Self-pay | Admitting: Family Medicine

## 2017-11-13 DIAGNOSIS — M419 Scoliosis, unspecified: Secondary | ICD-10-CM | POA: Diagnosis not present

## 2017-11-13 DIAGNOSIS — M158 Other polyosteoarthritis: Secondary | ICD-10-CM | POA: Insufficient documentation

## 2017-11-13 DIAGNOSIS — I700 Atherosclerosis of aorta: Secondary | ICD-10-CM | POA: Insufficient documentation

## 2018-03-05 ENCOUNTER — Other Ambulatory Visit (HOSPITAL_COMMUNITY): Payer: Self-pay | Admitting: Family Medicine

## 2018-03-05 DIAGNOSIS — Z1231 Encounter for screening mammogram for malignant neoplasm of breast: Secondary | ICD-10-CM

## 2018-03-12 ENCOUNTER — Encounter: Payer: Self-pay | Admitting: Internal Medicine

## 2018-03-14 ENCOUNTER — Other Ambulatory Visit (HOSPITAL_COMMUNITY): Payer: Self-pay | Admitting: Family Medicine

## 2018-03-14 ENCOUNTER — Encounter (HOSPITAL_COMMUNITY): Payer: Self-pay

## 2018-03-14 ENCOUNTER — Ambulatory Visit (HOSPITAL_COMMUNITY)
Admission: RE | Admit: 2018-03-14 | Discharge: 2018-03-14 | Disposition: A | Discharge status: Home / Self Care | Payer: Medicare HMO | Source: Ambulatory Visit | Attending: Family Medicine | Admitting: Family Medicine

## 2018-03-14 DIAGNOSIS — Z1231 Encounter for screening mammogram for malignant neoplasm of breast: Secondary | ICD-10-CM | POA: Diagnosis present

## 2020-01-14 ENCOUNTER — Other Ambulatory Visit (HOSPITAL_COMMUNITY): Payer: Self-pay | Admitting: Family Medicine

## 2020-01-14 DIAGNOSIS — Z1231 Encounter for screening mammogram for malignant neoplasm of breast: Secondary | ICD-10-CM

## 2020-01-24 ENCOUNTER — Other Ambulatory Visit: Payer: Self-pay

## 2020-01-24 ENCOUNTER — Ambulatory Visit (HOSPITAL_COMMUNITY)
Admission: RE | Admit: 2020-01-24 | Discharge: 2020-01-24 | Disposition: A | Discharge status: Home / Self Care | Payer: Medicare Other | Source: Ambulatory Visit | Attending: Family Medicine | Admitting: Family Medicine

## 2020-01-24 DIAGNOSIS — Z1231 Encounter for screening mammogram for malignant neoplasm of breast: Secondary | ICD-10-CM | POA: Diagnosis not present

## 2020-07-09 ENCOUNTER — Ambulatory Visit: Payer: Medicare Other | Attending: Internal Medicine

## 2020-07-09 DIAGNOSIS — Z23 Encounter for immunization: Secondary | ICD-10-CM

## 2020-07-09 NOTE — Progress Notes (Signed)
   Covid-19 Vaccination Clinic  Name:  Katelyn Powell    MRN: [id number] DOB: May 15, 1951  07/09/2020  [name] was observed post Covid-19 immunization for 15 minutes without incident. She was provided with Vaccine Information Sheet and instruction to access the V-Safe system.   [name] was instructed to call 911 with any severe reactions post vaccine: Marland Kitchen Difficulty breathing  . Swelling of face and throat  . A fast heartbeat  . A bad rash all over body  . Dizziness and weakness

## 2020-09-15 ENCOUNTER — Other Ambulatory Visit: Payer: Self-pay

## 2020-09-15 ENCOUNTER — Ambulatory Visit: Payer: Medicare Other | Admitting: Orthopaedic Surgery

## 2020-09-15 ENCOUNTER — Ambulatory Visit: Payer: Self-pay

## 2020-09-15 ENCOUNTER — Encounter: Payer: Self-pay | Admitting: Orthopaedic Surgery

## 2020-09-15 VITALS — BP 201/80 | HR 111 | Ht 67.5 in | Wt 146.0 lb

## 2020-09-15 DIAGNOSIS — M25561 Pain in right knee: Secondary | ICD-10-CM

## 2020-09-15 DIAGNOSIS — M1712 Unilateral primary osteoarthritis, left knee: Secondary | ICD-10-CM | POA: Insufficient documentation

## 2020-09-15 DIAGNOSIS — G8929 Other chronic pain: Secondary | ICD-10-CM

## 2020-09-15 DIAGNOSIS — M25562 Pain in left knee: Secondary | ICD-10-CM

## 2020-09-15 NOTE — Progress Notes (Signed)
Office Visit Note   Patient: Katelyn Powell           Date of Birth: 01-Apr-1951           MRN: [id number] Visit Date: 09/15/2020              Requested by: Lucia Gaskins, MD [address] [place],  [place] [address] PCP: Lucia Gaskins, MD   Assessment & Plan: Visit Diagnoses:  1. Chronic pain of both knees   2. Unilateral primary osteoarthritis, left knee     Plan: With her A1c elevated 8.1 intra-articular injection of her knee with cortisone would likely cause some hyperglycemia.  She will need to work on getting her diabetes under better control.  Would caution her about taking pain medication regularly for osteoarthritis pain and problems with tolerance and needing increased dosage.  We will set up for some physical therapy for her knee with strengthening and see her back in 6 weeks..  Follow-Up Instructions: Return in about 6 weeks (around 10/27/2020).   Orders:  Orders Placed This Encounter  Procedures  . XR Knee 1-2 Views Left   No orders of the defined types were placed in this encounter.     Procedures: No procedures performed   Clinical Data: No additional findings.   Subjective: Chief Complaint  Patient presents with  . Right Shoulder - Pain  . Left Shoulder - Pain  . Left Knee - Pain  . Lower Back - Pain    HPI 70 year old female seen with progressive left knee pain.  She fell last August states gradually she has had increased pain in her knee difficulty with inability to extend her knee to full extension.  She had knee arthroscopy by Dr. French Ana in 1997.  Patient states she was getting up from a chair and the chair fell backwards that she was getting up.  She hit the bed rail.  She states she has been under some stress and notes that if she takes Naprosyn and also on oxycodone she has gotten some relief.  She does have some bilateral shoulder pain difficulty getting comfortable.  She has diabetes and last A1c was 8.1.  She is on metformin and  previously was on some insulin taking 15 units in the morning and 30 at night and states that her CBGs went up when she was on insulin.  Wilder Glade was tried but she states this gave her yeast infection and she had to stop.  Patient has to hang onto the rail of the down left foot first when going down steps.  No true locking.  She had a fall September 16 when she was trying to clean a ceiling fan and fell off the chair.  Additional problems include hypertension on medications.  She takes baby aspirin a day.  She has children and grandchildren states their wellbeing causes some stress to her.  Review of Systems all other systems are noncontributory to HPI.   Objective: Vital Signs: BP (!) 201/80   Pulse (!) 111   Ht 5' 7.5" (1.715 m)   Wt 146 lb (66.2 kg)   BMI 22.53 kg/m   Physical Exam Constitutional:      Appearance: She is well-developed.  HENT:     Head: Normocephalic.     Right Ear: External ear normal.     Left Ear: External ear normal.  Eyes:     Pupils: Pupils are equal, round, and reactive to light.  Neck:     Thyroid:  No thyromegaly.     Trachea: No tracheal deviation.  Cardiovascular:     Rate and Rhythm: Normal rate.  Pulmonary:     Effort: Pulmonary effort is normal.  Abdominal:     Palpations: Abdomen is soft.  Skin:    General: Skin is warm and dry.  Neurological:     Mental Status: She is alert and oriented to person, place, and time.  Psychiatric:        Mood and Affect: Mood and affect normal.        Behavior: Behavior normal.     Ortho Exam patient is amatory with a left knee limp she lacks 10 degrees reaching full extension can flex 110 degrees she has medial lateral joint line tenderness and significant crepitus with flexion and extension.  Specialty Comments:  No specialty comments available.  Imaging: XR Knee 1-2 Views Left  Result Date: 09/15/2020 [facility] both knees bilateral sunrise patella x-ray obtained and reviewed.  This shows progression of  left knee osteoarthritis from 11/13/2017 images.  Minimal change right knee. Impression: Mild to moderate left knee osteoarthritis progressive from 2019.    PMFS History: Patient Active Problem List   Diagnosis Date Noted  . Unilateral primary osteoarthritis, left knee 09/15/2020  . Constipation 03/19/2013  . Anemia, iron deficiency 03/19/2013  . Abdominal wall hernia 03/19/2013  . Adenomatous colon polyp 03/19/2013   Past Medical History:  Diagnosis Date  . Depression   . Diabetes ([place])   . Fibromyalgia   . Hx of adenomatous colonic polyps 2011   Low anterior resection of rectal mass, tubular villous adenoma with high-grade dysplasia  . Hyperlipidemia   . Hypertension   . Migraines   . Sleep apnea     Family History  Problem Relation Age of Onset  . Colon cancer Neg Hx     Past Surgical History:  Procedure Laterality Date  . ABDOMINAL HYSTERECTOMY  2004  . APPENDECTOMY  2003  . BREAST BIOPSY  2000   left  . CHOLECYSTECTOMY  1980  . COLONOSCOPY  01/07/10   AGT:XMIWOE mass (tubulovillous adenoma with no carcinoma identified), multiple colonic polyps (tubular adenomas)/left-side transverse diverticula  . COLONOSCOPY N/A 04/04/2013   Procedure: COLONOSCOPY;  Surgeon: Daneil Dolin, MD;  Location: [facility];  Service: Endoscopy;  Laterality: N/A;  10:15  . ESOPHAGOGASTRODUODENOSCOPY  01/07/10   RMR: Small hiatal hernia, antral erosions, 1.5 cm area of raised mucosa in the antrum, 2 nonbleeding D2 AVMs. Biopsies unremarkable.  Marland Kitchen KNEE ARTHROSCOPY  3212   left  . LOW ANTERIOR BOWEL RESECTION  May 2011   Rectal mass (tubular villous adenoma with no carcinoma identified. She had high-grade dysplasia. 5. Colonic lymph nodes were negative, resection margins negative.Jenkins/Ziegler  . TUBAL LIGATION     Social History   Occupational History  . Not on file  Tobacco Use  . Smoking status: Current Every Day Smoker    Packs/day: 0.50    Types: Cigarettes  . Smokeless tobacco:  Never Used  Substance and Sexual Activity  . Alcohol use: No  . Drug use: No  . Sexual activity: Not on file

## 2020-09-15 NOTE — Addendum Note (Signed)
Addended by: Meyer Cory on: 09/15/2020 12:59 PM   Modules accepted: Orders

## 2020-10-27 ENCOUNTER — Ambulatory Visit: Payer: Medicare Other | Admitting: Orthopaedic Surgery

## 2020-11-10 ENCOUNTER — Ambulatory Visit: Payer: Medicare Other | Admitting: Orthopaedic Surgery

## 2020-11-10 ENCOUNTER — Other Ambulatory Visit: Payer: Self-pay | Admitting: Orthopaedic Surgery

## 2020-11-10 ENCOUNTER — Encounter: Payer: Self-pay | Admitting: Orthopaedic Surgery

## 2020-11-10 ENCOUNTER — Other Ambulatory Visit: Payer: Self-pay

## 2020-11-10 VITALS — BP 183/74 | HR 108 | Ht 67.5 in | Wt 146.0 lb

## 2020-11-10 DIAGNOSIS — M1712 Unilateral primary osteoarthritis, left knee: Secondary | ICD-10-CM | POA: Diagnosis not present

## 2020-11-10 NOTE — Progress Notes (Unsigned)
Office Visit Note   Patient: Katelyn Powell           Date of Birth: 02-Jul-1951           MRN: [id number] Visit Date: 11/10/2020              Requested by: Lucia Gaskins, MD [address] [place],  [place] [address] PCP: Lucia Gaskins, MD   Assessment & Plan: Visit Diagnoses: No diagnosis found.  Plan: Patient had left knee mild to moderate osteoarthritis by radiographs.  She is having repetitive catching and is fallen.  We will proceed with MRI scan to rule out cartilage flap tear or meniscal tear with the repetitive catching.  Should continue to work on improving her diabetes.  Office follow-up after MRI scan.  Follow-Up Instructions: No follow-ups on file.   Orders:  No orders of the defined types were placed in this encounter.  No orders of the defined types were placed in this encounter.     Procedures: No procedures performed   Clinical Data: No additional findings.   Subjective: Chief Complaint  Patient presents with  . Right Knee - Follow-up  . Left Knee - Follow-up    HPI 70 year old female returns 6-week follow-up of chronic left knee pain worse in the last 39months now with greater than 1 month history of repetitive catching.  Therapy has been ordered but she states she could not afford $30 co-pay with her insurance.  She does have diabetes and is working hard to improve her A1c previously 8.1.  Previous knee arthroscopy 1997.  Her knee has been catching but she has not had any falls in the last 6 weeks.  Prior to that she had had catching her knee and and had fallen when her knee caught while she is trying to clean a ceiling fan.  Patient is at home exercise program quad strengthening and also anti-inflammatories without relief.  She has not had recent intra-articular injection since her A1c remained elevated.  Review of Systems 14 point system update unchanged from 09/15/2020 office visit other than as mentioned above.   Objective: Vital Signs:  BP (!) 183/74 (BP Location: Left Arm, Patient Position: Sitting)   Pulse (!) 108   Ht 5' 7.5" (1.715 m)   Wt 146 lb (66.2 kg)   BMI 22.53 kg/m   Physical Exam Constitutional:      Appearance: She is well-developed.  HENT:     Head: Normocephalic.     Right Ear: External ear normal.     Left Ear: External ear normal.  Eyes:     Pupils: Pupils are equal, round, and reactive to light.  Neck:     Thyroid: No thyromegaly.     Trachea: No tracheal deviation.  Cardiovascular:     Rate and Rhythm: Normal rate.  Pulmonary:     Effort: Pulmonary effort is normal.  Abdominal:     Palpations: Abdomen is soft.  Skin:    General: Skin is warm and dry.  Neurological:     Mental Status: She is alert and oriented to person, place, and time.  Psychiatric:        Behavior: Behavior normal.     Ortho Exam patient has left knee medial and lateral joint tenderness 2+ knee effusion pain with patellofemoral loading.  Negative logroll to the hips.  No sciatic notch tenderness.  Crepitus with knee range of motion worse on left knee than right knee and with more rapid flexion extension she  gets sharp catching but no true locking does not require manipulation.  Distal pulses are palpable no plantar foot lesions.  Specialty Comments:  No specialty comments available.  Imaging: No results found.   PMFS History: Patient Active Problem List   Diagnosis Date Noted  . Unilateral primary osteoarthritis, left knee 09/15/2020  . Constipation 03/19/2013  . Anemia, iron deficiency 03/19/2013  . Abdominal wall hernia 03/19/2013  . Adenomatous colon polyp 03/19/2013   Past Medical History:  Diagnosis Date  . Depression   . Diabetes ([place])   . Fibromyalgia   . Hx of adenomatous colonic polyps 2011   Low anterior resection of rectal mass, tubular villous adenoma with high-grade dysplasia  . Hyperlipidemia   . Hypertension   . Migraines   . Sleep apnea     Family History  Problem Relation Age of  Onset  . Colon cancer Neg Hx     Past Surgical History:  Procedure Laterality Date  . ABDOMINAL HYSTERECTOMY  2004  . APPENDECTOMY  2003  . BREAST BIOPSY  2000   left  . CHOLECYSTECTOMY  1980  . COLONOSCOPY  01/07/10   XTA:VWPVXY mass (tubulovillous adenoma with no carcinoma identified), multiple colonic polyps (tubular adenomas)/left-side transverse diverticula  . COLONOSCOPY N/A 04/04/2013   Procedure: COLONOSCOPY;  Surgeon: Daneil Dolin, MD;  Location: [facility];  Service: Endoscopy;  Laterality: N/A;  10:15  . ESOPHAGOGASTRODUODENOSCOPY  01/07/10   RMR: Small hiatal hernia, antral erosions, 1.5 cm area of raised mucosa in the antrum, 2 nonbleeding D2 AVMs. Biopsies unremarkable.  Marland Kitchen KNEE ARTHROSCOPY  [id number]   left  . LOW ANTERIOR BOWEL RESECTION  May 2011   Rectal mass (tubular villous adenoma with no carcinoma identified. She had high-grade dysplasia. 5. Colonic lymph nodes were negative, resection margins negative.Jenkins/Ziegler  . TUBAL LIGATION     Social History   Occupational History  . Not on file  Tobacco Use  . Smoking status: Current Every Day Smoker    Packs/day: 0.50    Types: Cigarettes  . Smokeless tobacco: Never Used  Substance and Sexual Activity  . Alcohol use: No  . Drug use: No  . Sexual activity: Not on file

## 2020-11-27 ENCOUNTER — Other Ambulatory Visit: Payer: Self-pay | Admitting: Orthopaedic Surgery

## 2020-11-27 ENCOUNTER — Ambulatory Visit
Admission: RE | Admit: 2020-11-27 | Discharge: 2020-11-27 | Disposition: A | Discharge status: Home / Self Care | Payer: Medicare Other | Source: Ambulatory Visit | Attending: Orthopaedic Surgery | Admitting: Orthopaedic Surgery

## 2020-11-27 ENCOUNTER — Other Ambulatory Visit: Payer: Self-pay

## 2020-11-27 DIAGNOSIS — Z1389 Encounter for screening for other disorder: Secondary | ICD-10-CM

## 2020-11-27 DIAGNOSIS — M1712 Unilateral primary osteoarthritis, left knee: Secondary | ICD-10-CM

## 2021-01-12 ENCOUNTER — Ambulatory Visit: Payer: Medicare Other | Admitting: Orthopaedic Surgery

## 2021-01-13 ENCOUNTER — Encounter: Payer: Self-pay | Admitting: Orthopaedic Surgery

## 2021-01-13 ENCOUNTER — Ambulatory Visit (INDEPENDENT_AMBULATORY_CARE_PROVIDER_SITE_OTHER): Payer: Medicare Other | Admitting: Orthopaedic Surgery

## 2021-01-13 VITALS — BP 170/73 | HR 95

## 2021-01-13 DIAGNOSIS — M1712 Unilateral primary osteoarthritis, left knee: Secondary | ICD-10-CM

## 2021-01-13 NOTE — Progress Notes (Signed)
Office Visit Note   Patient: Katelyn Powell           Date of Birth: Apr 25, 1951           MRN: [id number] Visit Date: 01/13/2021              Requested by: Lucia Gaskins, MD [address] [place],  [place] [address] PCP: Lucia Gaskins, MD   Assessment & Plan: Visit Diagnoses:  1. Unilateral primary osteoarthritis, left knee     Plan: Reviewed MRI scan and report with her.  She has previous meniscectomy changes.  Tricompartment arthritic problems in her knee most severe in the lateral compartment.  I discussed with her she would be better off considering surgery then continue to take narcotic medication we discussed problems with tolerance, falls, memory loss, gradual increase in dosage with time and poor results with aquatics for chronic pain versus much better relief with narcotics for acute pain such as postsurgical.  We discussed returning if she has increased pain or has more problems walking or problems with swelling.  We discussed the importance of having good diabetic care in case she does require proceeding to total knee arthroplasty she would need to have her A1c less than 7.7 for current hospital presurgical restrictions  Follow-Up Instructions: Return if symptoms worsen or fail to improve.   Orders:  No orders of the defined types were placed in this encounter.  No orders of the defined types were placed in this encounter.     Procedures: No procedures performed   Clinical Data: No additional findings.   Subjective: Chief Complaint  Patient presents with  . Left Knee - Follow-up, Pain    HPI 71-month follow-up for continued problems with left knee pain.  Patient states her knee is doing a little bit better its worse when the weather changes and turns cold.  Sometimes she has problems with limping.  Her PCP put her on oxycodone 5 mg 3 times daily as needed.  Previous knee arthroscopy 1997 by Dr. French Ana with partial lateral meniscectomy.  MRI scan has  been obtained and is available for review.  Currently patient gets up fairly easily and is able to ambulate back and forth across the exam room without limp.  There is crepitus with knee range of motion and lateral more than medial joint line tenderness of the left knee.  Review of Systems all the systems updated unchanged from previous visit 09/15/2020.  Patient does have diabetes she is unsure of her last A1c level   Objective: Vital Signs: BP (!) 170/73   Pulse 95   Physical Exam Constitutional:      Appearance: She is well-developed.  HENT:     Head: Normocephalic.     Right Ear: External ear normal.     Left Ear: External ear normal.  Eyes:     Pupils: Pupils are equal, round, and reactive to light.  Neck:     Thyroid: No thyromegaly.     Trachea: No tracheal deviation.  Cardiovascular:     Rate and Rhythm: Normal rate.  Pulmonary:     Effort: Pulmonary effort is normal.  Abdominal:     Palpations: Abdomen is soft.  Skin:    General: Skin is warm and dry.  Neurological:     Mental Status: She is alert and oriented to person, place, and time.  Psychiatric:        Behavior: Behavior normal.     Ortho Exam patient gets from  sitting standing comfortably ambulatory without significant knee limp left knee.  Crepitus with knee range of motion trace acute effusion.  More lateral than medial joint line tenderness negative logroll the hips distal pulses are intact. Specialty Comments:  No specialty comments available.  Imaging: CLINICAL DATA:  Left knee pain. Prior surgery 1997.  EXAM: MRI OF THE LEFT KNEE WITHOUT CONTRAST  TECHNIQUE: Multiplanar, multisequence MR imaging of the knee was performed. No intravenous contrast was administered.  COMPARISON:  None.  FINDINGS: MENISCI  Medial: Mild degeneration of the medial meniscus. No discrete tear.  Lateral: Severe attenuation of the body of the lateral meniscus extending into the posterior horn which may  reflect a large complex tear versus prior meniscectomy.  LIGAMENTS  Cruciates: ACL and PCL are intact.  Collaterals: Medial collateral ligament is intact. Lateral collateral ligament complex is intact.  CARTILAGE  Patellofemoral: Partial-thickness cartilage loss of the patellofemoral compartment with areas of full-thickness cartilage loss along the trochlear groove and medial trochlea.  Medial: Partial-thickness cartilage loss of the medial femorotibial compartment.  Lateral: Extensive full-thickness cartilage loss of the lateral femorotibial compartment.  JOINT: Small joint effusion. Normal Hoffa's fat-pad. No plical thickening.  POPLITEAL FOSSA: Popliteus tendon is intact. No Baker's cyst.  EXTENSOR MECHANISM: Intact quadriceps tendon. Intact patellar tendon. Intact lateral patellar retinaculum. Intact medial patellar retinaculum. Intact MPFL.  BONES: No aggressive osseous lesion. No fracture or dislocation.  Other: No fluid collection or hematoma. Muscles are normal.  IMPRESSION: 1. Severe attenuation of the body of the lateral meniscus extending into the posterior horn which may reflect a large complex tear versus prior meniscectomy. 2. Tricompartmental cartilage abnormalities as described above most severe in the lateral femorotibial compartment.   Electronically Signed   By: Kathreen Devoid   On: 11/28/2020 07:54       PMFS History: Patient Active Problem List   Diagnosis Date Noted  . Unilateral primary osteoarthritis, left knee 09/15/2020  . Constipation 03/19/2013  . Anemia, iron deficiency 03/19/2013  . Abdominal wall hernia 03/19/2013  . Adenomatous colon polyp 03/19/2013   Past Medical History:  Diagnosis Date  . Depression   . Diabetes ([place])   . Fibromyalgia   . Hx of adenomatous colonic polyps 2011   Low anterior resection of rectal mass, tubular villous adenoma with high-grade dysplasia  . Hyperlipidemia   . Hypertension    . Migraines   . Sleep apnea     Family History  Problem Relation Age of Onset  . Colon cancer Neg Hx     Past Surgical History:  Procedure Laterality Date  . ABDOMINAL HYSTERECTOMY  2004  . APPENDECTOMY  2003  . BREAST BIOPSY  2000   left  . CHOLECYSTECTOMY  1980  . COLONOSCOPY  01/07/10   VVO:HYWVPX mass (tubulovillous adenoma with no carcinoma identified), multiple colonic polyps (tubular adenomas)/left-side transverse diverticula  . COLONOSCOPY N/A 04/04/2013   Procedure: COLONOSCOPY;  Surgeon: Daneil Dolin, MD;  Location: [facility];  Service: Endoscopy;  Laterality: N/A;  10:15  . ESOPHAGOGASTRODUODENOSCOPY  01/07/10   RMR: Small hiatal hernia, antral erosions, 1.5 cm area of raised mucosa in the antrum, 2 nonbleeding D2 AVMs. Biopsies unremarkable.  Marland Kitchen KNEE ARTHROSCOPY  [id number]   left  . LOW ANTERIOR BOWEL RESECTION  May 2011   Rectal mass (tubular villous adenoma with no carcinoma identified. She had high-grade dysplasia. 5. Colonic lymph nodes were negative, resection margins negative.Jenkins/Ziegler  . TUBAL LIGATION     Social History  Occupational History  . Not on file  Tobacco Use  . Smoking status: Current Every Day Smoker    Packs/day: 0.50    Types: Cigarettes  . Smokeless tobacco: Never Used  Substance and Sexual Activity  . Alcohol use: No  . Drug use: No  . Sexual activity: Not on file

## 2021-01-19 ENCOUNTER — Other Ambulatory Visit (HOSPITAL_COMMUNITY): Payer: Self-pay | Admitting: Family Medicine

## 2021-01-19 DIAGNOSIS — Z1231 Encounter for screening mammogram for malignant neoplasm of breast: Secondary | ICD-10-CM

## 2021-01-25 ENCOUNTER — Ambulatory Visit (HOSPITAL_COMMUNITY)
Admission: RE | Admit: 2021-01-25 | Discharge: 2021-01-25 | Disposition: A | Discharge status: Home / Self Care | Payer: Medicare Other | Source: Ambulatory Visit | Attending: Family Medicine | Admitting: Family Medicine

## 2021-01-25 DIAGNOSIS — Z1231 Encounter for screening mammogram for malignant neoplasm of breast: Secondary | ICD-10-CM | POA: Insufficient documentation

## 2021-03-10 DIAGNOSIS — E1165 Type 2 diabetes mellitus with hyperglycemia: Secondary | ICD-10-CM | POA: Diagnosis not present

## 2021-03-10 DIAGNOSIS — I110 Hypertensive heart disease with heart failure: Secondary | ICD-10-CM | POA: Diagnosis not present

## 2021-03-10 DIAGNOSIS — E7849 Other hyperlipidemia: Secondary | ICD-10-CM | POA: Diagnosis not present

## 2021-08-09 DIAGNOSIS — G8929 Other chronic pain: Secondary | ICD-10-CM | POA: Diagnosis not present

## 2021-08-09 DIAGNOSIS — Z79891 Long term (current) use of opiate analgesic: Secondary | ICD-10-CM | POA: Diagnosis not present

## 2021-08-09 DIAGNOSIS — M25569 Pain in unspecified knee: Secondary | ICD-10-CM | POA: Diagnosis not present

## 2021-08-23 DIAGNOSIS — E119 Type 2 diabetes mellitus without complications: Secondary | ICD-10-CM | POA: Diagnosis not present

## 2021-08-23 DIAGNOSIS — Z79891 Long term (current) use of opiate analgesic: Secondary | ICD-10-CM | POA: Diagnosis not present

## 2021-08-23 DIAGNOSIS — M797 Fibromyalgia: Secondary | ICD-10-CM | POA: Diagnosis not present

## 2021-08-23 DIAGNOSIS — I10 Essential (primary) hypertension: Secondary | ICD-10-CM | POA: Diagnosis not present

## 2021-08-23 DIAGNOSIS — E785 Hyperlipidemia, unspecified: Secondary | ICD-10-CM | POA: Diagnosis not present

## 2021-08-23 DIAGNOSIS — M545 Low back pain, unspecified: Secondary | ICD-10-CM | POA: Diagnosis not present

## 2021-08-23 DIAGNOSIS — Z139 Encounter for screening, unspecified: Secondary | ICD-10-CM | POA: Diagnosis not present

## 2021-09-14 ENCOUNTER — Encounter: Payer: Self-pay | Admitting: *Deleted

## 2021-10-27 ENCOUNTER — Other Ambulatory Visit: Payer: Self-pay

## 2021-10-27 ENCOUNTER — Ambulatory Visit (INDEPENDENT_AMBULATORY_CARE_PROVIDER_SITE_OTHER): Payer: Self-pay | Admitting: *Deleted

## 2021-10-27 VITALS — Ht 67.5 in | Wt 140.0 lb

## 2021-10-27 DIAGNOSIS — Z86010 Personal history of colonic polyps: Secondary | ICD-10-CM

## 2021-10-27 NOTE — Progress Notes (Addendum)
Gastroenterology Pre-Procedure Review  Request Date: 10/27/2021 Requesting Physician: Dr. Nevada Crane, Last TCS done 04/04/2013 by Dr. Gala Romney, colonic diverticulosis, acute ulcer, Previous TCS 01/2010 by Dr. Gala Romney, several tubular adenomas, rectal mass, tubulovillous adenoma, hx of  low anterior resection of rectal mass, path revealed high-grade dysplasia but no adenocarcinoma  PATIENT REVIEW QUESTIONS: The patient responded to the following health history questions as indicated:    1. Diabetes Melitis: yes, type II  2. Joint replacements in the past 12 months: no 3. Major health problems in the past 3 months: yes, headaches 4. Has an artificial valve or MVP: no 5. Has a defibrillator: no 6. Has been advised in past to take antibiotics in advance of a procedure like teeth cleaning: no 7. Family history of colon cancer:  no 8. Alcohol Use: no 9. Illicit drug Use: no 10. History of sleep apnea:  yes, but says doctor told her that she doesn't have it anymore, pt says she still thinks she has it 11. History of coronary artery or other vascular stents placed within the last 12 months: no 12. History of any prior anesthesia complications: no 13. Body mass index is 21.6 kg/m.    MEDICATIONS & ALLERGIES:    Patient reports the following regarding taking any blood thinners:   Plavix? no Aspirin? Yes, 81 mg daily, excedrin migraine as needed Coumadin? no Brilinta? no Xarelto? no Eliquis? no Pradaxa? no Savaysa? no Effient? no  Patient confirms/reports the following medications:  Current Outpatient Medications  Medication Sig Dispense Refill   amLODipine (NORVASC) 10 MG tablet Take 10 mg by mouth daily.     Ascorbic Acid (VITAMIN C) 1000 MG tablet Take 1,000 mg by mouth daily.     aspirin 81 MG tablet Take 81 mg by mouth daily.     aspirin-acetaminophen-caffeine (EXCEDRIN MIGRAINE) 250-250-65 MG per tablet Take 1 tablet by mouth as needed for pain.     Biotin 1000 MCG tablet Take 1,000 mcg by  mouth 2 (two) times daily.     Cholecalciferol (VITAMIN D PO) Take 1 tablet by mouth daily.     cloNIDine (CATAPRES) 0.2 MG tablet Take 0.2 mg by mouth 2 (two) times daily.     ferrous sulfate 325 (65 FE) MG tablet Take 325 mg by mouth daily with breakfast.     fish oil-omega-3 fatty acids 1000 MG capsule Take 1 g by mouth daily.     GARLIC PO Take 1 tablet by mouth daily. Takes 2 tablets daily.     lisinopril (ZESTRIL) 40 MG tablet daily at 6 (six) AM.     metFORMIN (GLUCOPHAGE) 500 MG tablet Take 500 mg by mouth 2 (two) times daily with a meal.     Multiple Vitamins-Minerals (MULTIVITAMIN WOMENS 50+ ADV) TABS Take by mouth daily at 6 (six) AM.     naproxen (NAPROSYN) 500 MG tablet Take 500 mg by mouth 2 (two) times daily.     oxyCODONE (OXY IR/ROXICODONE) 5 MG immediate release tablet Take 5 mg by mouth 2 (two) times daily.     simvastatin (ZOCOR) 20 MG tablet Take 20 mg by mouth daily.     No current facility-administered medications for this visit.    Patient confirms/reports the following allergies:  Allergies  Allergen Reactions   Other     Steroids cause palpations    Keflet [Cephalexin] Rash   Penicillins Rash    No orders of the defined types were placed in this encounter.   AUTHORIZATION INFORMATION Primary Insurance: [facility]  [facility]icare,  ID #: [id number],  Group #: [id number] Pre-Cert / Josem Kaufmann required:  Pre-Cert / Auth #:   SCHEDULE INFORMATION: Procedure has been scheduled as follows:  Date: 11/26/2021, Time: 10:45  Location: [facility] with Dr. Gala Romney  This Gastroenterology Pre-Precedure Review Form is being routed to the following provider(s): Aliene Altes, PA-C

## 2021-10-28 NOTE — Progress Notes (Signed)
Okay to schedule with propofol with Dr. Gala Romney.  ASA 2.  Hold iron x7 days prior to procedure  1 day prior to procedure: 250 mg metformin in morning and evening.  Day of procedure: No morning diabetes medications.

## 2021-11-02 ENCOUNTER — Telehealth: Payer: Self-pay | Admitting: *Deleted

## 2021-11-02 NOTE — Progress Notes (Signed)
Spoke to pt. Informed her that I would call her once Dr. Roseanne Kaufman schedules are available.  She voiced understanding

## 2021-11-02 NOTE — Telephone Encounter (Signed)
Patient wanted to know when her colonoscopy would be

## 2021-11-02 NOTE — Telephone Encounter (Signed)
Spoke to pt. Informed her that I would call her once Dr. Roseanne Kaufman schedules are available.  She voiced understanding.

## 2021-11-15 ENCOUNTER — Encounter: Payer: Self-pay | Admitting: *Deleted

## 2021-11-15 MED ORDER — PEG 3350-KCL-NA BICARB-NACL 420 G PO SOLR: 4000.0000 mL | Freq: Once | ORAL | 0 refills | Status: AC

## 2021-11-15 NOTE — Progress Notes (Signed)
Spoke to pt.  Scheduled procedure for 11/26/2021 at 10:45, arrival at 9:15 at [facility].  Reviewed prep instructions and medication adjustments with pt by phone.  Pt aware that I am mailing information out to her.  She is aware to pick up prep kit and OTC items required.  Confirmed mailing address with pt.

## 2021-11-15 NOTE — Addendum Note (Signed)
Addended by: Metro Kung on: 11/15/2021 03:23 PM   Modules accepted: Orders

## 2021-11-17 ENCOUNTER — Other Ambulatory Visit: Payer: Self-pay | Admitting: *Deleted

## 2021-11-26 ENCOUNTER — Other Ambulatory Visit: Payer: Self-pay

## 2021-11-26 ENCOUNTER — Ambulatory Visit (HOSPITAL_COMMUNITY): Payer: Medicare Other | Admitting: Anesthesiology

## 2021-11-26 ENCOUNTER — Ambulatory Visit (HOSPITAL_COMMUNITY)
Admission: RE | Admit: 2021-11-26 | Discharge: 2021-11-26 | Disposition: A | Discharge status: Home / Self Care | Payer: Medicare Other | Attending: Internal Medicine | Admitting: Internal Medicine

## 2021-11-26 ENCOUNTER — Encounter (HOSPITAL_COMMUNITY): Payer: Self-pay | Admitting: Internal Medicine

## 2021-11-26 ENCOUNTER — Ambulatory Visit (HOSPITAL_BASED_OUTPATIENT_CLINIC_OR_DEPARTMENT_OTHER): Payer: Medicare Other | Admitting: Anesthesiology

## 2021-11-26 ENCOUNTER — Encounter (HOSPITAL_COMMUNITY)
Admission: RE | Disposition: A | Discharge status: Home / Self Care | Payer: Self-pay | Source: Home / Self Care | Attending: Internal Medicine

## 2021-11-26 DIAGNOSIS — Q438 Other specified congenital malformations of intestine: Secondary | ICD-10-CM | POA: Insufficient documentation

## 2021-11-26 DIAGNOSIS — M797 Fibromyalgia: Secondary | ICD-10-CM | POA: Diagnosis not present

## 2021-11-26 DIAGNOSIS — F32A Depression, unspecified: Secondary | ICD-10-CM | POA: Diagnosis not present

## 2021-11-26 DIAGNOSIS — Z86010 Personal history of colonic polyps: Secondary | ICD-10-CM | POA: Diagnosis not present

## 2021-11-26 DIAGNOSIS — I10 Essential (primary) hypertension: Secondary | ICD-10-CM | POA: Insufficient documentation

## 2021-11-26 DIAGNOSIS — F17210 Nicotine dependence, cigarettes, uncomplicated: Secondary | ICD-10-CM | POA: Diagnosis not present

## 2021-11-26 DIAGNOSIS — Z7984 Long term (current) use of oral hypoglycemic drugs: Secondary | ICD-10-CM | POA: Insufficient documentation

## 2021-11-26 DIAGNOSIS — D175 Benign lipomatous neoplasm of intra-abdominal organs: Secondary | ICD-10-CM | POA: Diagnosis not present

## 2021-11-26 DIAGNOSIS — G4730 Sleep apnea, unspecified: Secondary | ICD-10-CM | POA: Diagnosis not present

## 2021-11-26 DIAGNOSIS — Z1211 Encounter for screening for malignant neoplasm of colon: Secondary | ICD-10-CM | POA: Insufficient documentation

## 2021-11-26 DIAGNOSIS — Z980 Intestinal bypass and anastomosis status: Secondary | ICD-10-CM | POA: Diagnosis not present

## 2021-11-26 DIAGNOSIS — E119 Type 2 diabetes mellitus without complications: Secondary | ICD-10-CM

## 2021-11-26 HISTORY — PX: COLONOSCOPY WITH PROPOFOL: SHX5780

## 2021-11-26 LAB — GLUCOSE, CAPILLARY: Glucose-Capillary: 137 mg/dL — ABNORMAL HIGH (ref 70–99)

## 2021-11-26 SURGERY — COLONOSCOPY WITH PROPOFOL
Anesthesia: General

## 2021-11-26 MED ORDER — PROPOFOL 10 MG/ML IV BOLUS
INTRAVENOUS | Status: DC | PRN
  Administered 2021-11-26: 80 mg via INTRAVENOUS
  Administered 2021-11-26: 20 mg via INTRAVENOUS

## 2021-11-26 MED ORDER — LIDOCAINE HCL (CARDIAC) PF 100 MG/5ML IV SOSY
PREFILLED_SYRINGE | INTRAVENOUS | Status: DC | PRN
  Administered 2021-11-26: 50 mg via INTRAVENOUS

## 2021-11-26 MED ORDER — LACTATED RINGERS IV SOLN: INTRAVENOUS | Status: DC

## 2021-11-26 MED ORDER — PROPOFOL 500 MG/50ML IV EMUL
INTRAVENOUS | Status: DC | PRN
Start: 2021-11-26 — End: 2021-11-26
  Administered 2021-11-26: 125 ug/kg/min via INTRAVENOUS

## 2021-11-26 NOTE — Anesthesia Postprocedure Evaluation (Signed)
Anesthesia Post Note ? ?Patient: Katelyn Powell ? ?Procedure(s) Performed: COLONOSCOPY WITH PROPOFOL ? ?Patient location during evaluation: Endoscopy ?Anesthesia Type: General ?Level of consciousness: awake and alert and oriented ?Pain management: pain level controlled ?Vital Signs Assessment: post-procedure vital signs reviewed and stable ?Respiratory status: spontaneous breathing, nonlabored ventilation and respiratory function stable ?Cardiovascular status: blood pressure returned to baseline and stable ?Postop Assessment: no apparent nausea or vomiting ?Anesthetic complications: no ? ? ?No notable events documented. ? ? ?Last Vitals:  ?Vitals:  ? 11/26/21 0949 11/26/21 1043  ?BP: (!) 184/74 (!) 123/53  ?Pulse: 87 68  ?Resp: 13 14  ?Temp: 37.1 ?C 36.6 ?C  ?SpO2:  98%  ?  ?Last Pain:  ?Vitals:  ? 11/26/21 1043  ?TempSrc: Oral  ?PainSc:   ? ? ?  ?  ?  ?  ?  ?  ? ?Aviannah Castoro C Aseem Sessums ? ? ? ? ?

## 2021-11-26 NOTE — Transfer of Care (Signed)
Immediate Anesthesia Transfer of Care Note ? ?Patient: Katelyn Powell ? ?Procedure(s) Performed: COLONOSCOPY WITH PROPOFOL ? ?Patient Location: Endoscopy Unit ? ?Anesthesia Type:General ? ?Level of Consciousness: awake ? ?Airway & Oxygen Therapy: Patient Spontanous Breathing ? ?Post-op Assessment: Report given to RN and Post -op Vital signs reviewed and stable ? ?Post vital signs: Reviewed and stable ? ?Last Vitals:  ?Vitals Value Taken Time  ?BP    ?Temp    ?Pulse 69   ?Resp 14   ?SpO2 99%   ? ? ?Last Pain:  ?Vitals:  ? 11/26/21 0949  ?TempSrc: Oral  ?PainSc:   ?   ? ?  ? ?Complications: No notable events documented. ?

## 2021-11-26 NOTE — Anesthesia Preprocedure Evaluation (Addendum)
Anesthesia Evaluation  ?Patient identified by MRN, date of birth, ID band ?Patient awake ? ? ? ?Reviewed: ?Allergy & Precautions, NPO status , Patient's Chart, lab work & pertinent test results ? ?Airway ?Mallampati: II ? ?TM Distance: >3 FB ?Neck ROM: Full ? ? ? Dental ? ?(+) Upper Dentures, Lower Dentures ?  ?Pulmonary ?sleep apnea , Current Smoker and Patient abstained from smoking.,  ?  ?Pulmonary exam normal ?breath sounds clear to auscultation ? ? ? ? ? ? Cardiovascular ?Exercise Tolerance: Good ?hypertension, Pt. on medications ?Normal cardiovascular exam ?Rhythm:Regular Rate:Normal ? ? ?  ?Neuro/Psych ? Headaches, PSYCHIATRIC DISORDERS Depression  Neuromuscular disease   ? GI/Hepatic ?negative GI ROS, Neg liver ROS,   ?Endo/Other  ?diabetes, Well Controlled, Type 2, Oral Hypoglycemic Agents ? Renal/GU ?negative Renal ROS  ?negative genitourinary ?  ?Musculoskeletal ? ?(+) Arthritis , Fibromyalgia - ? Abdominal ?  ?Peds ?negative pediatric ROS ?(+)  Hematology ? ?(+) Blood dyscrasia, anemia ,   ?Anesthesia Other Findings ? ? Reproductive/Obstetrics ?negative OB ROS ? ?  ? ? ? ? ? ? ? ? ? ? ? ? ? ?  ?  ? ? ? ? ? ?Anesthesia Physical ?Anesthesia Plan ? ?ASA: 3 ? ?Anesthesia Plan: General  ? ?Post-op Pain Management: Minimal or no pain anticipated  ? ?Induction: Intravenous ? ?PONV Risk Score and Plan: TIVA ? ?Airway Management Planned: Nasal Cannula and Natural Airway ? ?Additional Equipment:  ? ?Intra-op Plan:  ? ?Post-operative Plan:  ? ?Informed Consent: I have reviewed the patients History and Physical, chart, labs and discussed the procedure including the risks, benefits and alternatives for the proposed anesthesia with the patient or authorized representative who has indicated his/her understanding and acceptance.  ? ? ? ?Dental advisory given ? ?Plan Discussed with: CRNA and Surgeon ? ?Anesthesia Plan Comments:   ? ? ? ? ? ?Anesthesia Quick Evaluation ? ?

## 2021-11-26 NOTE — Discharge Instructions (Signed)
?  Colonoscopy ?Discharge Instructions ? ?Read the instructions outlined below and refer to this sheet in the next few weeks. These discharge instructions provide you with general information on caring for yourself after you leave the hospital. Your doctor may also give you specific instructions. While your treatment has been planned according to the most current medical practices available, unavoidable complications occasionally occur. If you have any problems or questions after discharge, call Dr. Gala Romney at [phone]. ?ACTIVITY ?You may resume your regular activity, but move at a slower pace for the next 24 hours.  ?Take frequent rest periods for the next 24 hours.  ?Walking will help get rid of the air and reduce the bloated feeling in your belly (abdomen).  ?No driving for 24 hours (because of the medicine (anesthesia) used during the test).   ?Do not sign any important legal documents or operate any machinery for 24 hours (because of the anesthesia used during the test).  ?NUTRITION ?Drink plenty of fluids.  ?You may resume your normal diet as instructed by your doctor.  ?Begin with a light meal and progress to your normal diet. Heavy or fried foods are harder to digest and may make you feel sick to your stomach (nauseated).  ?Avoid alcoholic beverages for 24 hours or as instructed.  ?MEDICATIONS ?You may resume your normal medications unless your doctor tells you otherwise.  ?WHAT YOU CAN EXPECT TODAY ?Some feelings of bloating in the abdomen.  ?Passage of more gas than usual.  ?Spotting of blood in your stool or on the toilet paper.  ?IF YOU HAD POLYPS REMOVED DURING THE COLONOSCOPY: ?No aspirin products for 7 days or as instructed.  ?No alcohol for 7 days or as instructed.  ?Eat a soft diet for the next 24 hours.  ?FINDING OUT THE RESULTS OF YOUR TEST ?Not all test results are available during your visit. If your test results are not back during the visit, make an appointment with your caregiver to find out the  results. Do not assume everything is normal if you have not heard from your caregiver or the medical facility. It is important for you to follow up on all of your test results.  ?SEEK IMMEDIATE MEDICAL ATTENTION IF: ?You have more than a spotting of blood in your stool.  ?Your belly is swollen (abdominal distention).  ?You are nauseated or vomiting.  ?You have a temperature over 101.  ?You have abdominal pain or discomfort that is severe or gets worse throughout the day.   ? ? ?No polyps found today ? ?I recommend 1 more colonoscopy in 5 years if your overall health permits ? ?At patient request, I called patient's son at [phone] -reviewed findings and recommendations ?

## 2021-11-26 NOTE — H&P (Signed)
$'@LOGO'Y$ @ ? ? ?Primary Care Physician:  Celene Squibb, MD ?Primary Gastroenterologist:  Dr. Gala Romney ? ?Pre-Procedure History & Physical: ?HPI:  Katelyn Powell is a 70 y.o. female here for surveillance colonoscopy.  History of distant advanced adenoma removed from her rectum via low anterior resection.  Benign ulcer at ileocecal valve 2014.  Here for surveillance colonoscopy. ?No bowel symptoms. ?Past Medical History:  ?Diagnosis Date  ? Depression   ? Diabetes ([place])   ? Fibromyalgia   ? Hx of adenomatous colonic polyps 2011  ? Low anterior resection of rectal mass, tubular villous adenoma with high-grade dysplasia  ? Hyperlipidemia   ? Hypertension   ? Migraines   ? Sleep apnea   ? ? ?Past Surgical History:  ?Procedure Laterality Date  ? ABDOMINAL HYSTERECTOMY  2004  ? APPENDECTOMY  2003  ? BREAST BIOPSY  2000  ? left  ? CHOLECYSTECTOMY  1980  ? COLONOSCOPY  01/07/10  ? AOZ:HYQMVH mass (tubulovillous adenoma with no carcinoma identified), multiple colonic polyps (tubular adenomas)/left-side transverse diverticula  ? COLONOSCOPY N/A 04/04/2013  ? Procedure: COLONOSCOPY;  Surgeon: Daneil Dolin, MD;  [facility]: [facility];  Service: Endoscopy;  Laterality: N/A;  10:15  ? ESOPHAGOGASTRODUODENOSCOPY  01/07/10  ? RMR: Small hiatal hernia, antral erosions, 1.5 cm area of raised mucosa in the antrum, 2 nonbleeding D2 AVMs. Biopsies unremarkable.  ? KNEE ARTHROSCOPY  [id number]  ? left  ? LOW ANTERIOR BOWEL RESECTION  May 2011  ? Rectal mass (tubular villous adenoma with no carcinoma identified. She had high-grade dysplasia. 5. Colonic lymph nodes were negative, resection margins negative.Jenkins/Ziegler  ? TUBAL LIGATION    ? ? ?Prior to Admission medications   ?Medication Sig Start Date End Date Taking? Authorizing Provider  ?amLODipine (NORVASC) 10 MG tablet Take 10 mg by mouth every evening. 06/26/20  Yes [provider]  ?Ascorbic Acid (VITAMIN C) 1000 MG tablet Take 1,000 mg by mouth daily.   Yes [provider]   ?aspirin 81 MG EC tablet Take 81 mg by mouth in the morning.   Yes [provider]  ?aspirin-acetaminophen-caffeine (EXCEDRIN MIGRAINE) [phone] MG per tablet Take 2 tablets by mouth 2 (two) times daily as needed for headache.   Yes [provider]  ?Biotin 1000 MCG tablet Take 1,000 mcg by mouth in the morning.   Yes [provider]  ?Camphor-Menthol-Methyl Sal (SALONPAS) 3.09-10-08 % PTCH Place 1 patch onto the skin daily as needed (pain.).   Yes [provider]  ?Cholecalciferol (VITAMIN D PO) Take 4,000 Units by mouth in the morning.   Yes [provider]  ?cloNIDine (CATAPRES) 0.2 MG tablet Take 0.2 mg by mouth 2 (two) times daily. 03/06/13  Yes [provider]  ?fexofenadine-pseudoephedrine (ALLEGRA-D) 60-120 MG 12 hr tablet Take 1 tablet by mouth 2 (two) times daily.   Yes [provider]  ?GARLIC PO Take 1 tablet by mouth in the morning and at bedtime.   Yes [provider]  ?lisinopril (ZESTRIL) 40 MG tablet Take 40 mg by mouth in the morning.   Yes [provider]  ?metFORMIN (GLUCOPHAGE) 500 MG tablet Take 500 mg by mouth 2 (two) times daily with a meal. 03/06/13  Yes [provider]  ?Multiple Vitamins-Minerals (HAIR SKIN & NAILS ADVANCED PO) Take 1 tablet by mouth in the morning and at bedtime.   Yes [provider]  ?Multiple Vitamins-Minerals (MULTIVITAMIN WOMENS 50+ ADV) TABS Take 1 tablet by mouth in the morning. Centrum  Silver for Adults 50+   Yes [provider]  ?naproxen (NAPROSYN) 500 MG tablet Take 500 mg by mouth 2 (two) times daily. 09/05/20  Yes [provider]  ?simvastatin (ZOCOR) 20 MG tablet Take 20 mg by mouth at bedtime. 01/03/13  Yes [provider]  ?ferrous sulfate 325 (65 FE) MG tablet Take 325 mg by mouth daily with breakfast.    [provider]  ?polyethylene glycol-electrolytes (NULYTELY) 420 g solution Take 4,000 mLs by mouth as directed. 11/15/21    [provider]  ? ? ?Allergies as of 11/15/2021 - Review Complete 10/27/2021  ?Allergen Reaction Noted  ? Other  03/19/2013  ? Keflet [cephalexin] Rash 04/01/2013  ? Penicillins Rash 03/19/2013  ? ? ?Family History  ?Problem Relation Age of Onset  ? Colon cancer Neg Hx   ? ? ?Social History  ? ?Socioeconomic History  ? Marital status: Single  ?  Spouse name: Not on file  ? Number of children: 3  ? Years of education: Not on file  ? Highest education level: Not on file  ?Occupational History  ? Not on file  ?Tobacco Use  ? Smoking status: Every Day  ?  Packs/day: 0.50  ?  Types: Cigarettes  ? Smokeless tobacco: Never  ?Substance and Sexual Activity  ? Alcohol use: No  ? Drug use: No  ? Sexual activity: Not on file  ?Other Topics Concern  ? Not on file  ?Social History Narrative  ? Not on file  ? ?Social Determinants of Health  ? ?Financial Resource Strain: Not on file  ?Food Insecurity: Not on file  ?Transportation Needs: Not on file  ?Physical Activity: Not on file  ?Stress: Not on file  ?Social Connections: Not on file  ?Intimate Partner Violence: Not on file  ? ? ?Review of Systems: ?See HPI, otherwise negative ROS ? ?Physical Exam: ?There were no vitals taken for this visit. ?General:   Alert,  Well-developed, well-nourished, pleasant and cooperative in NAD ? ?Mouth:  No deformity or lesions. ?Neck:  Supple; no masses or thyromegaly. No significant cervical adenopathy. ?Lungs:  Clear throughout to auscultation.   No wheezes, crackles, or rhonchi. No acute distress. ?Heart:  Regular rate and rhythm; no murmurs, clicks, rubs,  or gallops. ?Abdomen: Non-distended, normal bowel sounds.  Soft and nontender without appreciable mass or hepatosplenomegaly.  Small incisional hernia present.  Easily reducible. ?Pulses:  Normal pulses noted. ?Extremities:  Without clubbing or edema. ? ?Impression/Plan: 71 year old lady with a history of advanced adenoma requiring a low anterior resection in distant past.  Benign  ascending colon ulcer 2014; here for surveillance colonoscopy per plan. ?The risks, benefits, limitations, alternatives and imponderables have been reviewed with the patient. Questions have been answered. All parties are agreeable.   ? ? ? ? ?Notice: This dictation was prepared with Dragon dictation along with smaller phrase technology. Any transcriptional errors that result from this process are unintentional and may not be corrected upon review.  ? ?

## 2021-11-26 NOTE — Op Note (Signed)
[facility] ?Patient Name: Katelyn Powell ?Procedure Date: 11/26/2021 9:18 AM ?MRN: [id number] ?Date of Birth: [birth year] ?Attending MD: Norvel Richards , MD ?CSN: [id number] ?Age: 71 ?Admit Type: Outpatient ?Procedure:                Colonoscopy ?Indications:              High risk colon cancer surveillance: Personal  ?                          history of adenoma with villous component ?Providers:                Norvel Richards, MD, Lurline Del, RN, Kenney Houseman  ?                          [place] ?Referring MD:              ?Medicines:                Propofol per Anesthesia ?Complications:            No immediate complications. ?Estimated Blood Loss:     Estimated blood loss: none. ?Procedure:                Pre-Anesthesia Assessment: ?                          - Prior to the procedure, a History and Physical  ?                          was performed, and patient medications and  ?                          allergies were reviewed. The patient's tolerance of  ?                          previous anesthesia was also reviewed. The risks  ?                          and benefits of the procedure and the sedation  ?                          options and risks were discussed with the patient.  ?                          All questions were answered, and informed consent  ?                          was obtained. Prior Anticoagulants: The patient has  ?                          taken no previous anticoagulant or antiplatelet  ?                          agents. ASA Grade Assessment: II - A patient with  ?  mild systemic disease. After reviewing the risks  ?                          and benefits, the patient was deemed in  ?                          satisfactory condition to undergo the procedure. ?                          After obtaining informed consent, the colonoscope  ?                          was passed under direct vision. Throughout the  ?                          procedure, the patient's  blood pressure, pulse, and  ?                          oxygen saturations were monitored continuously. The  ?                          [id number] scope was introduced through the  ?                          anus and advanced to the the cecum, identified by  ?                          appendiceal orifice and ileocecal valve. The  ?                          colonoscopy was performed without difficulty. The  ?                          patient tolerated the procedure well. The quality  ?                          of the bowel preparation was adequate. ?Scope In: 10:19:54 AM ?Scope Out: 10:40:15 AM ?Scope Withdrawal Time: 0 hours 8 minutes 25 seconds  ?Total Procedure Duration: 0 hours 20 minutes 21 seconds  ?Findings: ?     The perianal and digital rectal examinations were normal. Surgical  ?     anastomosis found at 10 cm. ?     The colon (entire examined portion) was redundant. 1 cm fatty appearing  ?     submucosal nodule in ascending segment. Positive pillow sign. Redundant  ?     and elongated colon. External abdominal pressure and changing the  ?     patient's position the right required to reach the cecum. The remainder  ?     the colon mucosa appeared normal. ?Impression:               - Redundant colon. Colonic lipoma. Surgical  ?                          anastomosis at 10 cm ?                          -  No specimens collected. ?Moderate Sedation: ?     Moderate (conscious) sedation was personally administered by an  ?     anesthesia professional. The following parameters were monitored: oxygen  ?     saturation, heart rate, blood pressure, respiratory rate, EKG, adequacy  ?     of pulmonary ventilation, and response to care. ?Recommendation:           - Patient has a contact number available for  ?                          emergencies. The signs and symptoms of potential  ?                          delayed complications were discussed with the  ?                          patient. Return to normal  activities tomorrow.  ?                          Written discharge instructions were provided to the  ?                          patient. ?                          - Advance diet as tolerated. Repeat colonoscopy in  ?                          5 years if overall health permits. ?Procedure Code(s):        --- Professional --- ?                          [id number], Colonoscopy, flexible; diagnostic, including  ?                          collection of specimen(s) by brushing or washing,  ?                          when performed (separate procedure) ?Diagnosis Code(s):        --- Professional --- ?                          Z86.010, Personal history of colonic polyps ?                          Q43.8, Other specified congenital malformations of  ?                          intestine ?CPT copyright 2019 [employer]. All rights reserved. ?The codes documented in this report are preliminary and upon coder review may  ?be revised to meet current compliance requirements. ?Cristopher Estimable. Lauralee Waters, MD ?Norvel Richards, MD ?11/26/2021 10:51:11 AM ?This report has been signed electronically. ?Number of Addenda: 0 ?

## 2021-12-01 ENCOUNTER — Encounter (HOSPITAL_COMMUNITY): Payer: Self-pay | Admitting: Internal Medicine

## 2022-01-26 ENCOUNTER — Other Ambulatory Visit (HOSPITAL_COMMUNITY): Payer: Self-pay | Admitting: Internal Medicine

## 2022-01-26 DIAGNOSIS — Z1231 Encounter for screening mammogram for malignant neoplasm of breast: Secondary | ICD-10-CM

## 2022-02-03 ENCOUNTER — Ambulatory Visit (HOSPITAL_COMMUNITY)
Admission: RE | Admit: 2022-02-03 | Discharge: 2022-02-03 | Disposition: A | Discharge status: Home / Self Care | Payer: Medicare Other | Source: Ambulatory Visit | Attending: Internal Medicine | Admitting: Internal Medicine

## 2022-02-03 DIAGNOSIS — Z1231 Encounter for screening mammogram for malignant neoplasm of breast: Secondary | ICD-10-CM | POA: Insufficient documentation

## 2022-03-17 DIAGNOSIS — G5621 Lesion of ulnar nerve, right upper limb: Secondary | ICD-10-CM | POA: Diagnosis not present

## 2022-03-17 DIAGNOSIS — G5601 Carpal tunnel syndrome, right upper limb: Secondary | ICD-10-CM | POA: Diagnosis not present

## 2022-03-19 DIAGNOSIS — E119 Type 2 diabetes mellitus without complications: Secondary | ICD-10-CM | POA: Diagnosis not present

## 2022-03-21 DIAGNOSIS — G5601 Carpal tunnel syndrome, right upper limb: Secondary | ICD-10-CM | POA: Diagnosis not present

## 2022-03-21 DIAGNOSIS — G5621 Lesion of ulnar nerve, right upper limb: Secondary | ICD-10-CM | POA: Diagnosis not present

## 2022-03-25 IMAGING — MG MM DIGITAL SCREENING BILAT W/ TOMO AND CAD
8 series · 9 of 24 positions shown · non-contrast
Comparison: Previous exam(s).

CLINICAL DATA: Screening.

EXAM:
DIGITAL SCREENING BILATERAL MAMMOGRAM WITH TOMOSYNTHESIS AND CAD
TECHNIQUE: Bilateral screening digital craniocaudal and mediolateral oblique
mammograms were obtained. Bilateral screening digital breast
tomosynthesis was performed. The images were evaluated with
computer-aided detection.

[L MLO synth-2D]
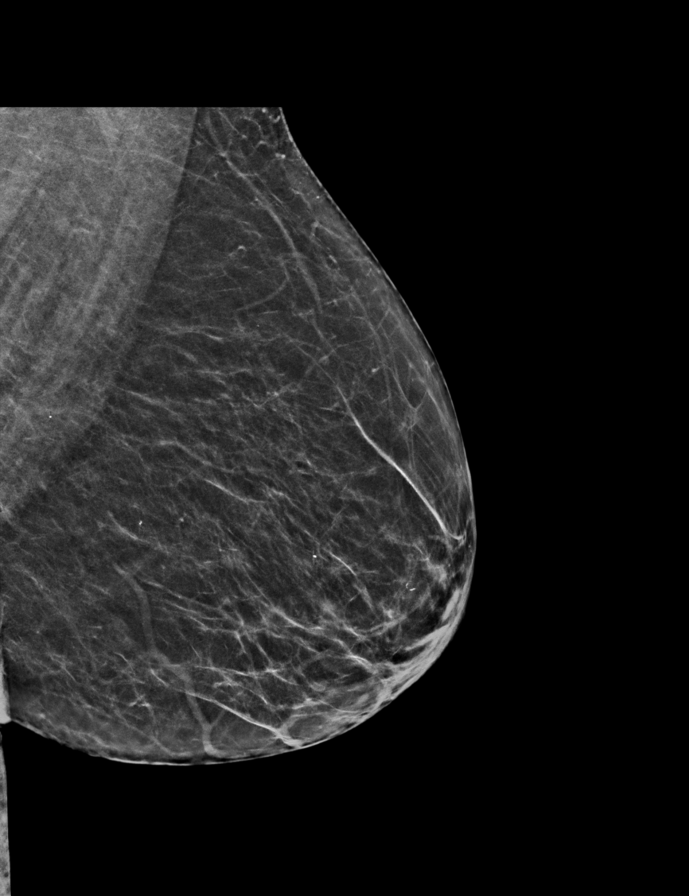

[R CC synth-2D]
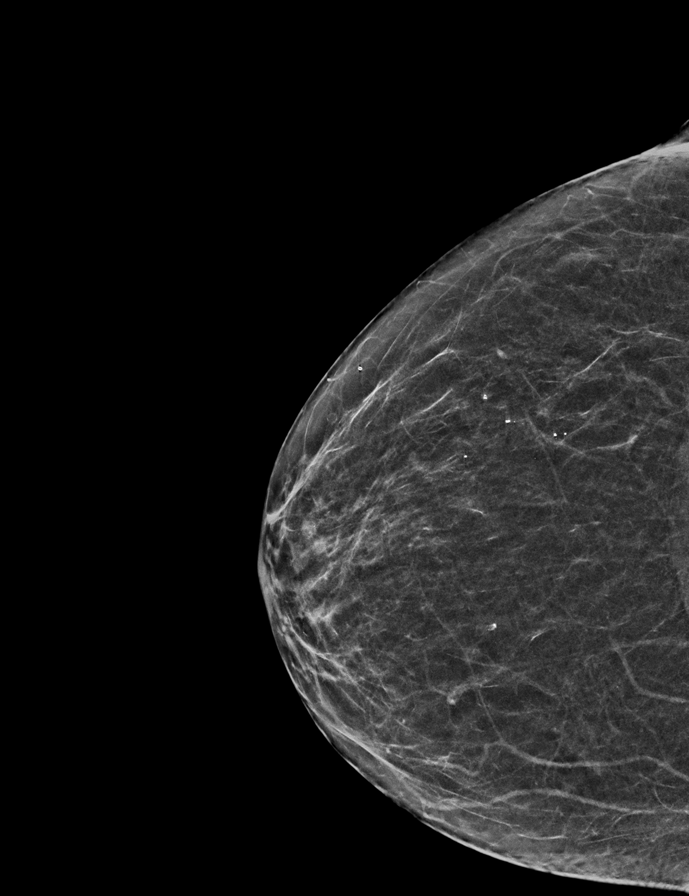

[R MLO synth-2D]
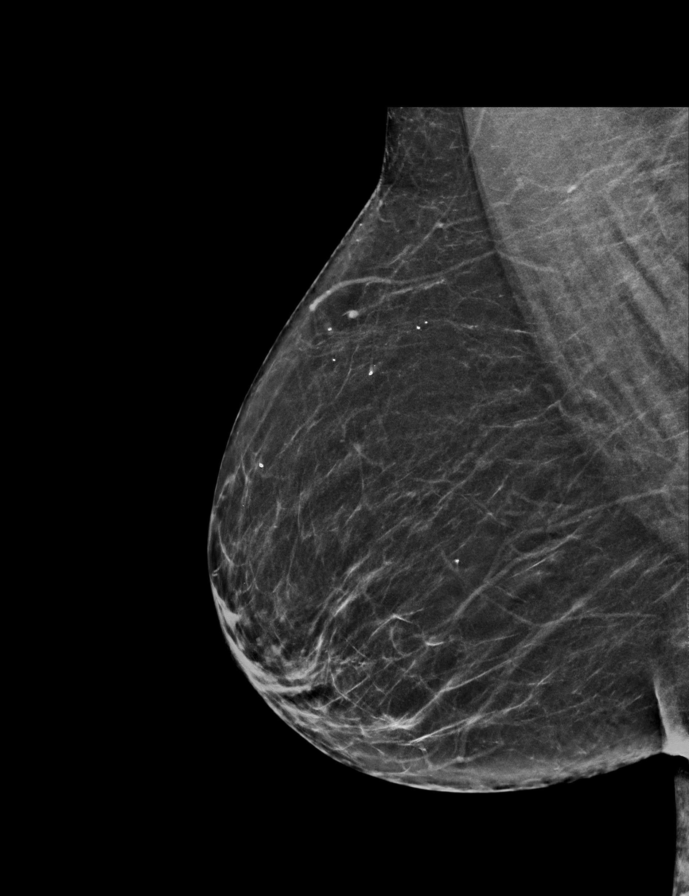

[L CC synth-2D]
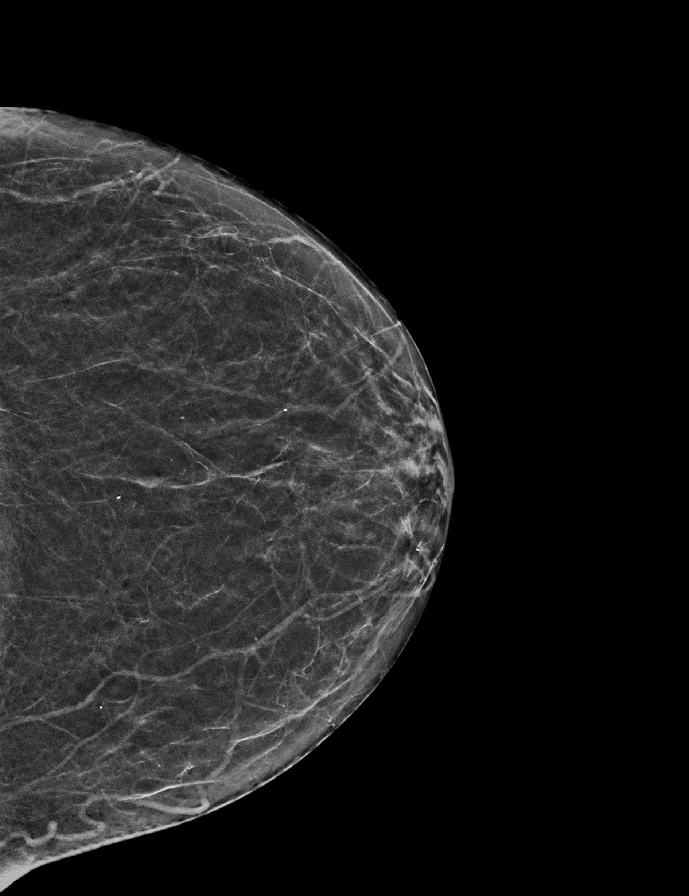

[R CC tomo · 2 of 48 frames shown]
[frame 16/48]
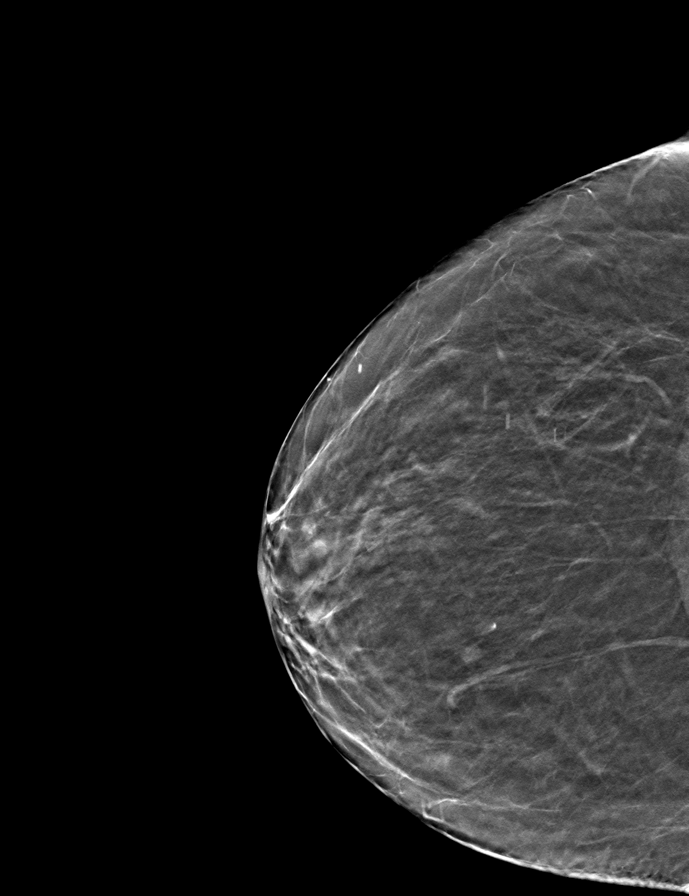
[frame 25/48]
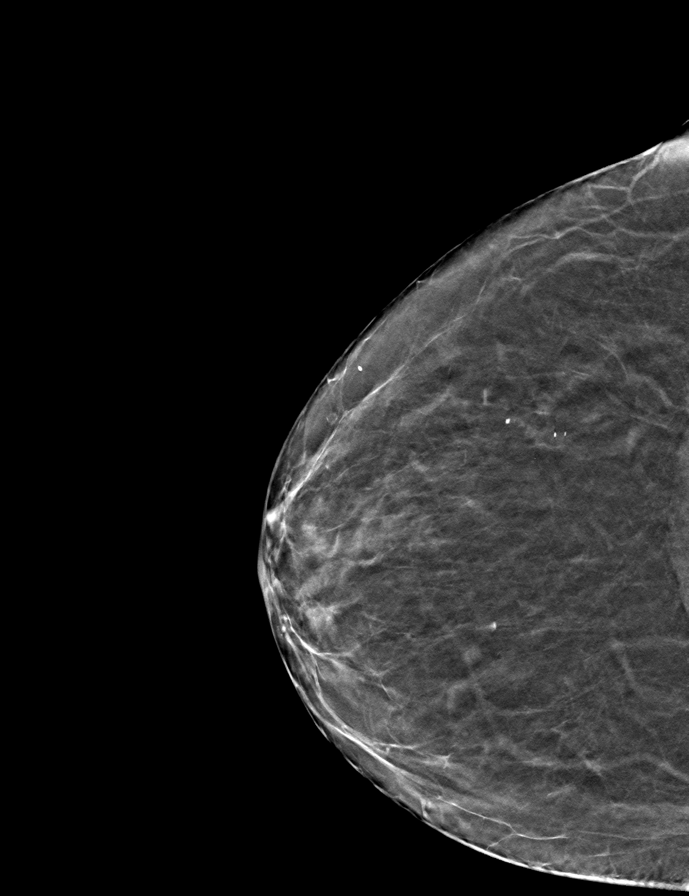

[L MLO tomo · tomo slice 28/55.0]
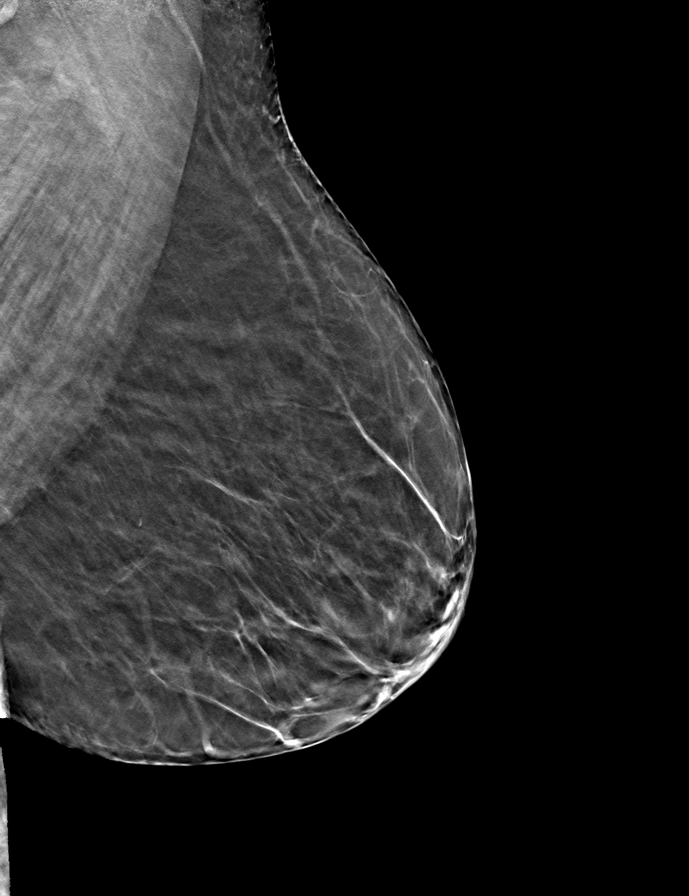

[R MLO tomo · tomo slice 29/57.0]
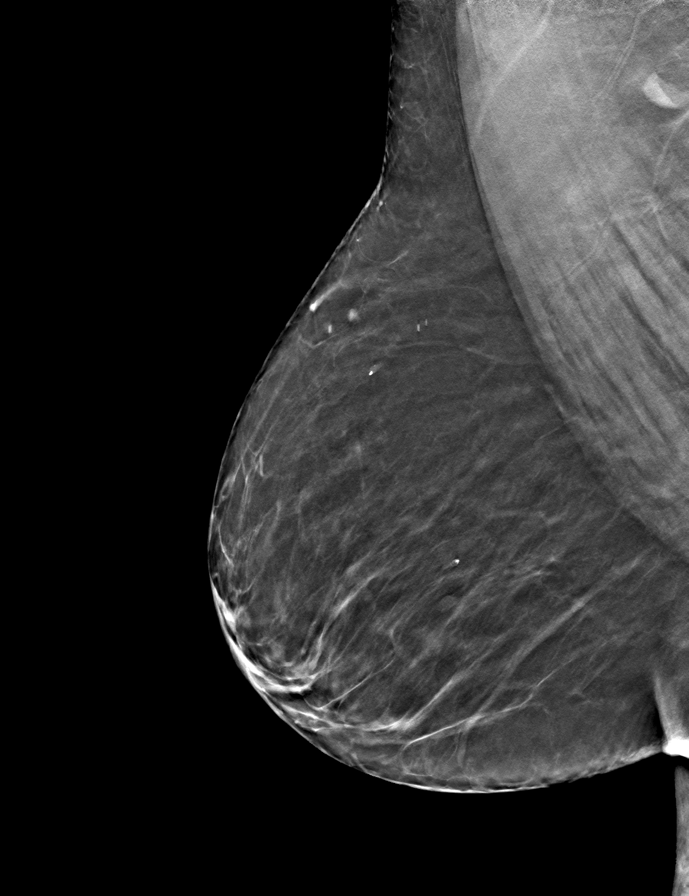

[L CC tomo · tomo slice 24/47.0]
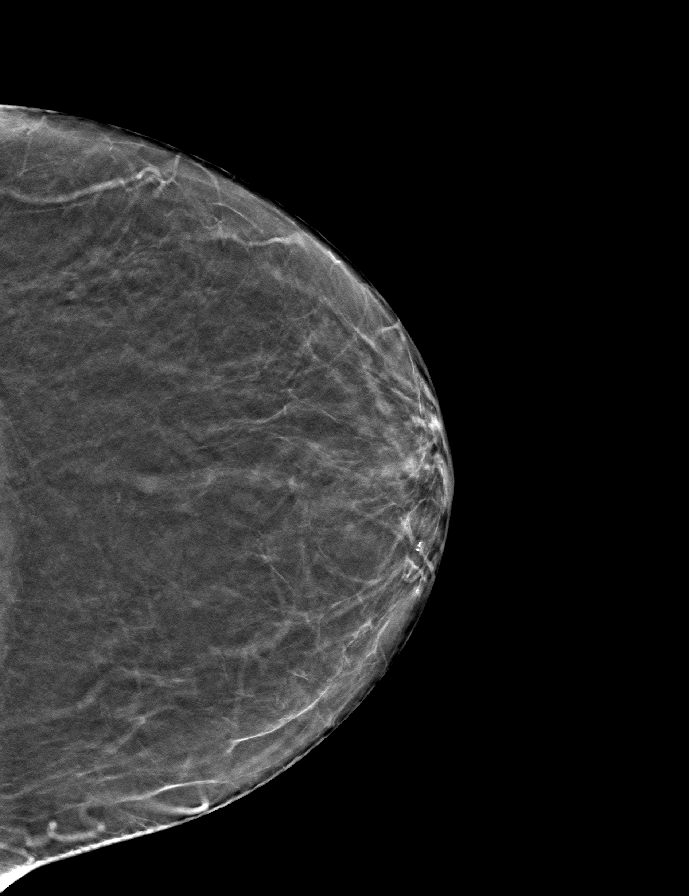

[9 of 24 positions shown; findings below may reference images not displayed]

ACR Breast Density Category b: There are scattered areas of
fibroglandular density.
FINDINGS: There are no findings suspicious for malignancy. The images were
evaluated with computer-aided detection.
IMPRESSION: No mammographic evidence of malignancy. A result letter of this
screening mammogram will be mailed directly to the patient.

RECOMMENDATION:
Screening mammogram in one year. (Code:WJ-I-BG6)

BI-RADS CATEGORY  1: Negative.

## 2022-04-12 DIAGNOSIS — G43719 Chronic migraine without aura, intractable, without status migrainosus: Secondary | ICD-10-CM | POA: Diagnosis not present

## 2022-04-12 DIAGNOSIS — Z79899 Other long term (current) drug therapy: Secondary | ICD-10-CM | POA: Diagnosis not present

## 2022-05-04 DIAGNOSIS — M542 Cervicalgia: Secondary | ICD-10-CM | POA: Diagnosis not present

## 2022-05-04 DIAGNOSIS — M791 Myalgia, unspecified site: Secondary | ICD-10-CM | POA: Diagnosis not present

## 2022-05-04 DIAGNOSIS — G518 Other disorders of facial nerve: Secondary | ICD-10-CM | POA: Diagnosis not present

## 2022-05-04 DIAGNOSIS — G43719 Chronic migraine without aura, intractable, without status migrainosus: Secondary | ICD-10-CM | POA: Diagnosis not present

## 2022-05-05 ENCOUNTER — Other Ambulatory Visit: Payer: Self-pay | Admitting: Specialist

## 2022-05-05 DIAGNOSIS — R519 Headache, unspecified: Secondary | ICD-10-CM

## 2022-05-11 DIAGNOSIS — G629 Polyneuropathy, unspecified: Secondary | ICD-10-CM | POA: Diagnosis not present

## 2022-05-11 NOTE — Progress Notes (Deleted)
Synopsis: Referred for COPD by Orie Rout, MD  Subjective:   PATIENT ID: Katelyn Powell GENDER: female DOB: 03-04-1951, MRN: [id number]  No chief complaint on file.  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  Past Medical History:  Diagnosis Date   Depression    Diabetes ([place])    Fibromyalgia    Hx of adenomatous colonic polyps 2011   Low anterior resection of rectal mass, tubular villous adenoma with high-grade dysplasia   Hyperlipidemia    Hypertension    Migraines    Sleep apnea      Family History  Problem Relation Age of Onset   Colon cancer Neg Hx      Past Surgical History:  Procedure Laterality Date   ABDOMINAL HYSTERECTOMY  2004   APPENDECTOMY  2003   BREAST BIOPSY  2000   left   CHOLECYSTECTOMY  1980   COLONOSCOPY  01/07/10   [id number] mass (tubulovillous adenoma with no carcinoma identified), multiple colonic polyps (tubular adenomas)/left-side transverse diverticula   COLONOSCOPY N/A 04/04/2013   Procedure: COLONOSCOPY;  Surgeon: Daneil Dolin, MD;  Location: [facility];  Service: Endoscopy;  Laterality: N/A;  10:15   COLONOSCOPY WITH PROPOFOL N/A 11/26/2021   Procedure: COLONOSCOPY WITH PROPOFOL;  Surgeon: Daneil Dolin, MD;  [facility]: [facility];  Service: Endoscopy;  Laterality: N/A;  10:45 / ASA 2   ESOPHAGOGASTRODUODENOSCOPY  01/07/10   RMR: Small hiatal hernia, antral erosions, 1.5 cm area of raised mucosa in the antrum, 2 nonbleeding D2 AVMs. Biopsies unremarkable.   KNEE ARTHROSCOPY  3557   left   LOW ANTERIOR BOWEL RESECTION  May 2011   Rectal mass (tubular villous adenoma with no carcinoma identified. She had high-grade dysplasia. 5. Colonic lymph nodes were negative, resection margins negative.Jenkins/Ziegler   TUBAL LIGATION      Social History   Socioeconomic History   Marital status: Single    Spouse name: Not on file    Number of children: 3   Years of education: Not on file   Highest education level: Not on file  Occupational History   Not on file  Tobacco Use   Smoking status: Every Day    Packs/day: 0.50    Types: Cigarettes   Smokeless tobacco: Never  Substance and Sexual Activity   Alcohol use: No   Drug use: No   Sexual activity: Not on file  Other Topics Concern   Not on file  Social History Narrative   Not on file   Social Determinants of Health   Financial Resource Strain: Not on file  Food Insecurity: Not on file  Transportation Needs: Not on file  Physical Activity: Not on file  Stress: Not on file  Social Connections: Not on file  Intimate Partner Violence: Not on file     Allergies  Allergen Reactions   Other     Steroids cause palpations    Keflet [Cephalexin] Rash   Penicillins Rash     Outpatient Medications Prior to Visit  Medication Sig Dispense Refill   amLODipine (NORVASC) 10 MG tablet Take 10 mg by mouth every evening.     Ascorbic Acid (VITAMIN C) 1000 MG tablet Take 1,000 mg by mouth daily.     aspirin 81 MG EC tablet Take 81 mg by mouth in the morning.     aspirin-acetaminophen-caffeine ([place]) 250-250-65 MG  per tablet Take 2 tablets by mouth 2 (two) times daily as needed for headache.     Biotin 1000 MCG tablet Take 1,000 mcg by mouth in the morning.     Camphor-Menthol-Methyl Sal (SALONPAS) 3.09-10-08 % PTCH Place 1 patch onto the skin daily as needed (pain.).     Cholecalciferol (VITAMIN D PO) Take 4,000 Units by mouth in the morning.     cloNIDine (CATAPRES) 0.2 MG tablet Take 0.2 mg by mouth 2 (two) times daily.     ferrous sulfate 325 (65 FE) MG tablet Take 325 mg by mouth daily with breakfast.     fexofenadine-pseudoephedrine (ALLEGRA-D) 60-120 MG 12 hr tablet Take 1 tablet by mouth 2 (two) times daily.     GARLIC PO Take 1 tablet by mouth in the morning and at bedtime.     lisinopril (ZESTRIL) 40 MG tablet Take 40 mg by mouth in the  morning.     metFORMIN (GLUCOPHAGE) 500 MG tablet Take 500 mg by mouth 2 (two) times daily with a meal.     Multiple Vitamins-Minerals (HAIR SKIN & NAILS ADVANCED PO) Take 1 tablet by mouth in the morning and at bedtime.     Multiple Vitamins-Minerals (MULTIVITAMIN WOMENS 50+ ADV) TABS Take 1 tablet by mouth in the morning. Centrum Silver for Adults 50+     naproxen (NAPROSYN) 500 MG tablet Take 500 mg by mouth 2 (two) times daily.     polyethylene glycol-electrolytes (NULYTELY) 420 g solution Take 4,000 mLs by mouth as directed.     simvastatin (ZOCOR) 20 MG tablet Take 20 mg by mouth at bedtime.     No facility-administered medications prior to visit.       Objective:   Physical Exam:  General appearance: 71 y.o., female, NAD, conversant, female, NAD, conversant  Eyes: anicteric sclerae; PERRL, tracking appropriately HENT: NCAT; MMM Neck: Trachea midline; no lymphadenopathy, no JVD Lungs: CTAB, no crackles, no wheeze, with normal respiratory effort CV: RRR, no murmur  Abdomen: Soft, non-tender; non-distended, BS present  Extremities: No peripheral edema, warm Skin: Normal turgor and texture; no rash Psych: Appropriate affect Neuro: Alert and oriented to person and place, no focal deficit     There were no vitals filed for this visit.   on *** LPM *** RA BMI Readings from Last 3 Encounters:  11/26/21 21.46 kg/m  10/27/21 21.60 kg/m  11/10/20 22.53 kg/m   Wt Readings from Last 3 Encounters:  11/26/21 137 lb (62.1 kg)  10/27/21 140 lb (63.5 kg)  11/10/20 146 lb (66.2 kg)     CBC    Component Value Date/Time   WBC 4.4 04/15/2013 0810   RBC 4.23 04/15/2013 0810   HGB 11.7 (L) 04/15/2013 0810   HCT 36.4 04/15/2013 0810   HCT 39 03/19/2013 0000   PLT 317 04/15/2013 0810   PLT 318 03/19/2013 0000   MCV 86.1 04/15/2013 0810   MCV 88.1 03/19/2013 0000   MCH 27.7 04/15/2013 0810   MCHC 32.1 04/15/2013 0810   RDW 14.2 04/15/2013 0810   LYMPHSABS 1.2 04/15/2013 0810   MONOABS 0.4  04/15/2013 0810   EOSABS 0.2 04/15/2013 0810   BASOSABS 0.0 04/15/2013 0810    ***  Chest Imaging: No recent chest imaging available for review  Pulmonary Functions Testing Results:     No data to display          FeNO: ***  Pathology: ***  Echocardiogram: ***  Heart Catheterization: ***    Assessment & Plan:  Plan:      Maryjane Hurter, MD [facility] 05/11/2022 5:36 PM

## 2022-05-12 ENCOUNTER — Institutional Professional Consult (permissible substitution): Payer: Medicare Other | Admitting: Student

## 2022-05-23 DIAGNOSIS — M542 Cervicalgia: Secondary | ICD-10-CM | POA: Diagnosis not present

## 2022-05-23 DIAGNOSIS — G518 Other disorders of facial nerve: Secondary | ICD-10-CM | POA: Diagnosis not present

## 2022-05-23 DIAGNOSIS — G43719 Chronic migraine without aura, intractable, without status migrainosus: Secondary | ICD-10-CM | POA: Diagnosis not present

## 2022-05-23 DIAGNOSIS — M791 Myalgia, unspecified site: Secondary | ICD-10-CM | POA: Diagnosis not present

## 2022-06-07 DIAGNOSIS — M542 Cervicalgia: Secondary | ICD-10-CM | POA: Diagnosis not present

## 2022-06-07 DIAGNOSIS — G43719 Chronic migraine without aura, intractable, without status migrainosus: Secondary | ICD-10-CM | POA: Diagnosis not present

## 2022-06-07 DIAGNOSIS — M791 Myalgia, unspecified site: Secondary | ICD-10-CM | POA: Diagnosis not present

## 2022-06-07 DIAGNOSIS — G518 Other disorders of facial nerve: Secondary | ICD-10-CM | POA: Diagnosis not present

## 2022-06-13 ENCOUNTER — Other Ambulatory Visit: Payer: Medicare Other

## 2022-06-13 DIAGNOSIS — G5603 Carpal tunnel syndrome, bilateral upper limbs: Secondary | ICD-10-CM | POA: Diagnosis not present

## 2022-06-13 DIAGNOSIS — G629 Polyneuropathy, unspecified: Secondary | ICD-10-CM | POA: Diagnosis not present

## 2022-06-27 DIAGNOSIS — E119 Type 2 diabetes mellitus without complications: Secondary | ICD-10-CM | POA: Diagnosis not present

## 2022-07-05 DIAGNOSIS — R809 Proteinuria, unspecified: Secondary | ICD-10-CM | POA: Diagnosis not present

## 2022-07-05 DIAGNOSIS — E1165 Type 2 diabetes mellitus with hyperglycemia: Secondary | ICD-10-CM | POA: Diagnosis not present

## 2022-07-05 DIAGNOSIS — I10 Essential (primary) hypertension: Secondary | ICD-10-CM | POA: Diagnosis not present

## 2022-07-07 DIAGNOSIS — G518 Other disorders of facial nerve: Secondary | ICD-10-CM | POA: Diagnosis not present

## 2022-07-07 DIAGNOSIS — M791 Myalgia, unspecified site: Secondary | ICD-10-CM | POA: Diagnosis not present

## 2022-07-07 DIAGNOSIS — M542 Cervicalgia: Secondary | ICD-10-CM | POA: Diagnosis not present

## 2022-07-07 DIAGNOSIS — G43719 Chronic migraine without aura, intractable, without status migrainosus: Secondary | ICD-10-CM | POA: Diagnosis not present

## 2022-07-08 ENCOUNTER — Other Ambulatory Visit: Payer: Medicare Other

## 2022-07-12 DIAGNOSIS — E1165 Type 2 diabetes mellitus with hyperglycemia: Secondary | ICD-10-CM | POA: Diagnosis not present

## 2022-07-12 DIAGNOSIS — I10 Essential (primary) hypertension: Secondary | ICD-10-CM | POA: Diagnosis not present

## 2022-07-12 DIAGNOSIS — D126 Benign neoplasm of colon, unspecified: Secondary | ICD-10-CM | POA: Diagnosis not present

## 2022-07-12 DIAGNOSIS — K2970 Gastritis, unspecified, without bleeding: Secondary | ICD-10-CM | POA: Diagnosis not present

## 2022-07-12 DIAGNOSIS — E785 Hyperlipidemia, unspecified: Secondary | ICD-10-CM | POA: Diagnosis not present

## 2022-07-12 DIAGNOSIS — G4733 Obstructive sleep apnea (adult) (pediatric): Secondary | ICD-10-CM | POA: Diagnosis not present

## 2022-07-12 DIAGNOSIS — F17200 Nicotine dependence, unspecified, uncomplicated: Secondary | ICD-10-CM | POA: Diagnosis not present

## 2022-07-12 DIAGNOSIS — J309 Allergic rhinitis, unspecified: Secondary | ICD-10-CM | POA: Diagnosis not present

## 2022-07-12 DIAGNOSIS — R809 Proteinuria, unspecified: Secondary | ICD-10-CM | POA: Diagnosis not present

## 2022-07-12 DIAGNOSIS — K5900 Constipation, unspecified: Secondary | ICD-10-CM | POA: Diagnosis not present

## 2022-07-12 DIAGNOSIS — M179 Osteoarthritis of knee, unspecified: Secondary | ICD-10-CM | POA: Diagnosis not present

## 2022-07-26 DIAGNOSIS — M791 Myalgia, unspecified site: Secondary | ICD-10-CM | POA: Diagnosis not present

## 2022-07-26 DIAGNOSIS — M542 Cervicalgia: Secondary | ICD-10-CM | POA: Diagnosis not present

## 2022-07-26 DIAGNOSIS — G43719 Chronic migraine without aura, intractable, without status migrainosus: Secondary | ICD-10-CM | POA: Diagnosis not present

## 2022-07-26 DIAGNOSIS — G518 Other disorders of facial nerve: Secondary | ICD-10-CM | POA: Diagnosis not present

## 2022-08-12 DIAGNOSIS — E119 Type 2 diabetes mellitus without complications: Secondary | ICD-10-CM | POA: Diagnosis not present

## 2022-08-18 DIAGNOSIS — G518 Other disorders of facial nerve: Secondary | ICD-10-CM | POA: Diagnosis not present

## 2022-08-18 DIAGNOSIS — M542 Cervicalgia: Secondary | ICD-10-CM | POA: Diagnosis not present

## 2022-08-18 DIAGNOSIS — M791 Myalgia, unspecified site: Secondary | ICD-10-CM | POA: Diagnosis not present

## 2022-08-18 DIAGNOSIS — G43719 Chronic migraine without aura, intractable, without status migrainosus: Secondary | ICD-10-CM | POA: Diagnosis not present

## 2022-09-27 DIAGNOSIS — G43719 Chronic migraine without aura, intractable, without status migrainosus: Secondary | ICD-10-CM | POA: Diagnosis not present

## 2022-09-27 DIAGNOSIS — M791 Myalgia, unspecified site: Secondary | ICD-10-CM | POA: Diagnosis not present

## 2022-09-27 DIAGNOSIS — M542 Cervicalgia: Secondary | ICD-10-CM | POA: Diagnosis not present

## 2022-09-27 DIAGNOSIS — G518 Other disorders of facial nerve: Secondary | ICD-10-CM | POA: Diagnosis not present

## 2022-10-10 DIAGNOSIS — H524 Presbyopia: Secondary | ICD-10-CM | POA: Diagnosis not present

## 2022-10-10 DIAGNOSIS — H35033 Hypertensive retinopathy, bilateral: Secondary | ICD-10-CM | POA: Diagnosis not present

## 2022-10-25 DIAGNOSIS — M542 Cervicalgia: Secondary | ICD-10-CM | POA: Diagnosis not present

## 2022-10-25 DIAGNOSIS — G518 Other disorders of facial nerve: Secondary | ICD-10-CM | POA: Diagnosis not present

## 2022-10-25 DIAGNOSIS — G43719 Chronic migraine without aura, intractable, without status migrainosus: Secondary | ICD-10-CM | POA: Diagnosis not present

## 2022-10-25 DIAGNOSIS — M791 Myalgia, unspecified site: Secondary | ICD-10-CM | POA: Diagnosis not present

## 2022-11-10 DIAGNOSIS — E1165 Type 2 diabetes mellitus with hyperglycemia: Secondary | ICD-10-CM | POA: Diagnosis not present

## 2022-11-10 DIAGNOSIS — I10 Essential (primary) hypertension: Secondary | ICD-10-CM | POA: Diagnosis not present

## 2022-11-10 DIAGNOSIS — E119 Type 2 diabetes mellitus without complications: Secondary | ICD-10-CM | POA: Diagnosis not present

## 2022-11-10 DIAGNOSIS — R809 Proteinuria, unspecified: Secondary | ICD-10-CM | POA: Diagnosis not present

## 2022-11-16 DIAGNOSIS — G4733 Obstructive sleep apnea (adult) (pediatric): Secondary | ICD-10-CM | POA: Diagnosis not present

## 2022-11-16 DIAGNOSIS — K5900 Constipation, unspecified: Secondary | ICD-10-CM | POA: Diagnosis not present

## 2022-11-16 DIAGNOSIS — E1165 Type 2 diabetes mellitus with hyperglycemia: Secondary | ICD-10-CM | POA: Diagnosis not present

## 2022-11-16 DIAGNOSIS — J309 Allergic rhinitis, unspecified: Secondary | ICD-10-CM | POA: Diagnosis not present

## 2022-11-16 DIAGNOSIS — I10 Essential (primary) hypertension: Secondary | ICD-10-CM | POA: Diagnosis not present

## 2022-11-16 DIAGNOSIS — F17210 Nicotine dependence, cigarettes, uncomplicated: Secondary | ICD-10-CM | POA: Diagnosis not present

## 2022-11-16 DIAGNOSIS — D126 Benign neoplasm of colon, unspecified: Secondary | ICD-10-CM | POA: Diagnosis not present

## 2022-11-16 DIAGNOSIS — M1712 Unilateral primary osteoarthritis, left knee: Secondary | ICD-10-CM | POA: Diagnosis not present

## 2022-11-16 DIAGNOSIS — K2970 Gastritis, unspecified, without bleeding: Secondary | ICD-10-CM | POA: Diagnosis not present

## 2022-11-16 DIAGNOSIS — E785 Hyperlipidemia, unspecified: Secondary | ICD-10-CM | POA: Diagnosis not present

## 2022-11-16 DIAGNOSIS — M179 Osteoarthritis of knee, unspecified: Secondary | ICD-10-CM | POA: Diagnosis not present

## 2022-11-16 DIAGNOSIS — R809 Proteinuria, unspecified: Secondary | ICD-10-CM | POA: Diagnosis not present

## 2022-12-06 DIAGNOSIS — G43719 Chronic migraine without aura, intractable, without status migrainosus: Secondary | ICD-10-CM | POA: Diagnosis not present

## 2022-12-06 DIAGNOSIS — G518 Other disorders of facial nerve: Secondary | ICD-10-CM | POA: Diagnosis not present

## 2022-12-06 DIAGNOSIS — M542 Cervicalgia: Secondary | ICD-10-CM | POA: Diagnosis not present

## 2022-12-06 DIAGNOSIS — M791 Myalgia, unspecified site: Secondary | ICD-10-CM | POA: Diagnosis not present

## 2023-01-24 DIAGNOSIS — Z6820 Body mass index (BMI) 20.0-20.9, adult: Secondary | ICD-10-CM | POA: Diagnosis not present

## 2023-01-24 DIAGNOSIS — J069 Acute upper respiratory infection, unspecified: Secondary | ICD-10-CM | POA: Diagnosis not present

## 2023-01-24 DIAGNOSIS — Z713 Dietary counseling and surveillance: Secondary | ICD-10-CM | POA: Diagnosis not present

## 2023-01-24 DIAGNOSIS — Z91199 Patient's noncompliance with other medical treatment and regimen due to unspecified reason: Secondary | ICD-10-CM | POA: Diagnosis not present

## 2023-01-24 DIAGNOSIS — F17210 Nicotine dependence, cigarettes, uncomplicated: Secondary | ICD-10-CM | POA: Diagnosis not present

## 2023-01-24 DIAGNOSIS — J309 Allergic rhinitis, unspecified: Secondary | ICD-10-CM | POA: Diagnosis not present

## 2023-01-31 DIAGNOSIS — G518 Other disorders of facial nerve: Secondary | ICD-10-CM | POA: Diagnosis not present

## 2023-01-31 DIAGNOSIS — M791 Myalgia, unspecified site: Secondary | ICD-10-CM | POA: Diagnosis not present

## 2023-01-31 DIAGNOSIS — G43719 Chronic migraine without aura, intractable, without status migrainosus: Secondary | ICD-10-CM | POA: Diagnosis not present

## 2023-01-31 DIAGNOSIS — M542 Cervicalgia: Secondary | ICD-10-CM | POA: Diagnosis not present

## 2023-02-08 DIAGNOSIS — H35033 Hypertensive retinopathy, bilateral: Secondary | ICD-10-CM | POA: Diagnosis not present

## 2023-03-14 DIAGNOSIS — M542 Cervicalgia: Secondary | ICD-10-CM | POA: Diagnosis not present

## 2023-03-14 DIAGNOSIS — G518 Other disorders of facial nerve: Secondary | ICD-10-CM | POA: Diagnosis not present

## 2023-03-14 DIAGNOSIS — M791 Myalgia, unspecified site: Secondary | ICD-10-CM | POA: Diagnosis not present

## 2023-03-14 DIAGNOSIS — G43719 Chronic migraine without aura, intractable, without status migrainosus: Secondary | ICD-10-CM | POA: Diagnosis not present

## 2023-05-29 DIAGNOSIS — E1165 Type 2 diabetes mellitus with hyperglycemia: Secondary | ICD-10-CM | POA: Diagnosis not present

## 2023-05-29 DIAGNOSIS — R809 Proteinuria, unspecified: Secondary | ICD-10-CM | POA: Diagnosis not present

## 2023-05-29 DIAGNOSIS — I10 Essential (primary) hypertension: Secondary | ICD-10-CM | POA: Diagnosis not present

## 2023-06-07 ENCOUNTER — Other Ambulatory Visit (HOSPITAL_COMMUNITY): Payer: Self-pay | Admitting: Family Medicine

## 2023-06-07 DIAGNOSIS — G4733 Obstructive sleep apnea (adult) (pediatric): Secondary | ICD-10-CM | POA: Diagnosis not present

## 2023-06-07 DIAGNOSIS — J309 Allergic rhinitis, unspecified: Secondary | ICD-10-CM | POA: Diagnosis not present

## 2023-06-07 DIAGNOSIS — E1165 Type 2 diabetes mellitus with hyperglycemia: Secondary | ICD-10-CM | POA: Diagnosis not present

## 2023-06-07 DIAGNOSIS — Z13820 Encounter for screening for osteoporosis: Secondary | ICD-10-CM

## 2023-06-07 DIAGNOSIS — I10 Essential (primary) hypertension: Secondary | ICD-10-CM | POA: Diagnosis not present

## 2023-06-07 DIAGNOSIS — M1712 Unilateral primary osteoarthritis, left knee: Secondary | ICD-10-CM | POA: Diagnosis not present

## 2023-06-07 DIAGNOSIS — M179 Osteoarthritis of knee, unspecified: Secondary | ICD-10-CM | POA: Diagnosis not present

## 2023-06-07 DIAGNOSIS — D126 Benign neoplasm of colon, unspecified: Secondary | ICD-10-CM | POA: Diagnosis not present

## 2023-06-07 DIAGNOSIS — K5900 Constipation, unspecified: Secondary | ICD-10-CM | POA: Diagnosis not present

## 2023-06-07 DIAGNOSIS — Z1231 Encounter for screening mammogram for malignant neoplasm of breast: Secondary | ICD-10-CM

## 2023-06-07 DIAGNOSIS — F17210 Nicotine dependence, cigarettes, uncomplicated: Secondary | ICD-10-CM | POA: Diagnosis not present

## 2023-06-07 DIAGNOSIS — R809 Proteinuria, unspecified: Secondary | ICD-10-CM | POA: Diagnosis not present

## 2023-06-07 DIAGNOSIS — K2970 Gastritis, unspecified, without bleeding: Secondary | ICD-10-CM | POA: Diagnosis not present

## 2023-06-07 DIAGNOSIS — E785 Hyperlipidemia, unspecified: Secondary | ICD-10-CM | POA: Diagnosis not present

## 2023-08-06 DEATH — deceased
